# Patient Record
Sex: Female | Born: 1975
Health system: Southern US, Community
[De-identification: ages and names within clinical notes are randomized; demographics above are authoritative.]

## PROBLEM LIST (undated history)

## (undated) DIAGNOSIS — E785 Hyperlipidemia, unspecified: Secondary | ICD-10-CM

## (undated) DIAGNOSIS — I10 Essential (primary) hypertension: Secondary | ICD-10-CM

## (undated) HISTORY — DX: Hyperlipidemia, unspecified: E78.5

## (undated) HISTORY — DX: Essential (primary) hypertension: I10

---

## 2017-09-14 ENCOUNTER — Ambulatory Visit: Payer: Self-pay | Admitting: Adult Health

## 2017-09-20 ENCOUNTER — Ambulatory Visit: Payer: Self-pay | Admitting: Ophthalmology

## 2017-11-30 ENCOUNTER — Ambulatory Visit: Payer: Self-pay | Admitting: Gerontology

## 2017-11-30 ENCOUNTER — Other Ambulatory Visit: Payer: Self-pay

## 2017-11-30 ENCOUNTER — Encounter: Payer: Self-pay | Admitting: Adult Health

## 2017-11-30 VITALS — BP 111/78 | HR 85 | Temp 98.2°F | Ht 62.99 in | Wt 172.1 lb

## 2017-11-30 DIAGNOSIS — I10 Essential (primary) hypertension: Secondary | ICD-10-CM

## 2017-11-30 DIAGNOSIS — Z0000 Encounter for general adult medical examination without abnormal findings: Secondary | ICD-10-CM

## 2017-11-30 DIAGNOSIS — B353 Tinea pedis: Secondary | ICD-10-CM

## 2017-11-30 MED ORDER — AMLODIPINE BESYLATE 5 MG PO TABS: 5.0000 mg | ORAL_TABLET | Freq: Every day | ORAL | 3 refills | Status: DC

## 2017-11-30 MED ORDER — TERBINAFINE HCL 1 % EX CREA: TOPICAL_CREAM | Freq: Two times a day (BID) | CUTANEOUS | Status: DC

## 2017-11-30 MED ORDER — TERBINAFINE HCL 1 % EX CREA: 1.0000 "application " | TOPICAL_CREAM | Freq: Two times a day (BID) | CUTANEOUS | 0 refills | Status: DC

## 2017-11-30 NOTE — Patient Instructions (Addendum)
Excercise DASH Eating Plan DASH stands for "Dietary Approaches to Stop Hypertension." The DASH eating plan is a healthy eating plan that has been shown to reduce high blood pressure (hypertension). It may also reduce your risk for type 2 diabetes, heart disease, and stroke. The DASH eating plan may also help with weight loss. What are tips for following this plan? General guidelines  Avoid eating more than 2,300 mg (milligrams) of salt (sodium) a day. If you have hypertension, you may need to reduce your sodium intake to 1,500 mg a day.  Limit alcohol intake to no more than 1 drink a day for nonpregnant women and 2 drinks a day for men. One drink equals 12 oz of beer, 5 oz of wine, or 1 oz of hard liquor.  Work with your health care provider to maintain a healthy body weight or to lose weight. Ask what an ideal weight is for you.  Get at least 30 minutes of exercise that causes your heart to beat faster (aerobic exercise) most days of the week. Activities may include walking, swimming, or biking.  Work with your health care provider or diet and nutrition specialist (dietitian) to adjust your eating plan to your individual calorie needs. Reading food labels  Check food labels for the amount of sodium per serving. Choose foods with less than 5 percent of the Daily Value of sodium. Generally, foods with less than 300 mg of sodium per serving fit into this eating plan.  To find whole grains, look for the word "whole" as the first word in the ingredient list. Shopping  Buy products labeled as "low-sodium" or "no salt added."  Buy fresh foods. Avoid canned foods and premade or frozen meals. Cooking  Avoid adding salt when cooking. Use salt-free seasonings or herbs instead of table salt or sea salt. Check with your health care provider or pharmacist before using salt substitutes.  Do not fry foods. Cook foods using healthy methods such as baking, boiling, grilling, and broiling  instead.  Cook with heart-healthy oils, such as olive, canola, soybean, or sunflower oil. Meal planning   Eat a balanced diet that includes: ? 5 or more servings of fruits and vegetables each day. At each meal, try to fill half of your plate with fruits and vegetables. ? Up to 6-8 servings of whole grains each day. ? Less than 6 oz of lean meat, poultry, or fish each day. A 3-oz serving of meat is about the same size as a deck of cards. One egg equals 1 oz. ? 2 servings of low-fat dairy each day. ? A serving of nuts, seeds, or beans 5 times each week. ? Heart-healthy fats. Healthy fats called Omega-3 fatty acids are found in foods such as flaxseeds and coldwater fish, like sardines, salmon, and mackerel.  Limit how much you eat of the following: ? Canned or prepackaged foods. ? Food that is high in trans fat, such as fried foods. ? Food that is high in saturated fat, such as fatty meat. ? Sweets, desserts, sugary drinks, and other foods with added sugar. ? Full-fat dairy products.  Do not salt foods before eating.  Try to eat at least 2 vegetarian meals each week.  Eat more home-cooked food and less restaurant, buffet, and fast food.  When eating at a restaurant, ask that your food be prepared with less salt or no salt, if possible. What foods are recommended? The items listed may not be a complete list. Talk with your dietitian about  what dietary choices are best for you. Grains Whole-grain or whole-wheat bread. Whole-grain or whole-wheat pasta. Brown rice. [place]row. Bulgur. Whole-grain and low-sodium cereals. Pita bread. Low-fat, low-sodium crackers. Whole-wheat flour tortillas. Vegetables Fresh or frozen vegetables (raw, steamed, roasted, or grilled). Low-sodium or reduced-sodium tomato and vegetable juice. Low-sodium or reduced-sodium tomato sauce and tomato paste. Low-sodium or reduced-sodium canned vegetables. Fruits All fresh, dried, or frozen fruit. Canned fruit in  natural juice (without added sugar). Meat and other protein foods Skinless chicken or [place]. Ground chicken or [place]. Pork with fat trimmed off. Fish and seafood. Egg whites. Dried beans, peas, or lentils. Unsalted nuts, nut butters, and seeds. Unsalted canned beans. Lean cuts of beef with fat trimmed off. Low-sodium, lean deli meat. Dairy Low-fat (1%) or fat-free (skim) milk. Fat-free, low-fat, or reduced-fat cheeses. Nonfat, low-sodium ricotta or cottage cheese. Low-fat or nonfat yogurt. Low-fat, low-sodium cheese. Fats and oils Soft margarine without trans fats. Vegetable oil. Low-fat, reduced-fat, or light mayonnaise and salad dressings (reduced-sodium). Canola, safflower, olive, soybean, and sunflower oils. Avocado. Seasoning and other foods Herbs. Spices. Seasoning mixes without salt. Unsalted popcorn and pretzels. Fat-free sweets. What foods are not recommended? The items listed may not be a complete list. Talk with your dietitian about what dietary choices are best for you. Grains Baked goods made with fat, such as croissants, muffins, or some breads. Dry pasta or rice meal packs. Vegetables Creamed or fried vegetables. Vegetables in a cheese sauce. Regular canned vegetables (not low-sodium or reduced-sodium). Regular canned tomato sauce and paste (not low-sodium or reduced-sodium). Regular tomato and vegetable juice (not low-sodium or reduced-sodium). Angie Fava. Olives. Fruits Canned fruit in a light or heavy syrup. Fried fruit. Fruit in cream or butter sauce. Meat and other protein foods Fatty cuts of meat. Ribs. Fried meat. Berniece Salines. Sausage. Bologna and other processed lunch meats. Salami. Fatback. Hotdogs. Bratwurst. Salted nuts and seeds. Canned beans with added salt. Canned or smoked fish. Whole eggs or egg yolks. Chicken or [place] with skin. Dairy Whole or 2% milk, cream, and half-and-half. Whole or full-fat cream cheese. Whole-fat or sweetened yogurt. Full-fat cheese. Nondairy  creamers. Whipped toppings. Processed cheese and cheese spreads. Fats and oils Butter. Stick margarine. Lard. Shortening. Ghee. Bacon fat. Tropical oils, such as coconut, palm kernel, or palm oil. Seasoning and other foods Salted popcorn and pretzels. Onion salt, garlic salt, seasoned salt, table salt, and sea salt. Worcestershire sauce. Tartar sauce. Barbecue sauce. Teriyaki sauce. Soy sauce, including reduced-sodium. Steak sauce. Canned and packaged gravies. Fish sauce. Oyster sauce. Cocktail sauce. Horseradish that you find on the shelf. Ketchup. Mustard. Meat flavorings and tenderizers. Bouillon cubes. Hot sauce and Tabasco sauce. Premade or packaged marinades. Premade or packaged taco seasonings. Relishes. Regular salad dressings. Where to find more information:  National Heart, Lung, and [place]: https://wilson-eaton.com/  American Heart Association: www.heart.org Summary  The DASH eating plan is a healthy eating plan that has been shown to reduce high blood pressure (hypertension). It may also reduce your risk for type 2 diabetes, heart disease, and stroke.  With the DASH eating plan, you should limit salt (sodium) intake to 2,300 mg a day. If you have hypertension, you may need to reduce your sodium intake to 1,500 mg a day.  When on the DASH eating plan, aim to eat more fresh fruits and vegetables, whole grains, lean proteins, low-fat dairy, and heart-healthy fats.  Work with your health care provider or diet and nutrition specialist (dietitian) to adjust your eating plan to your  individual calorie needs. This information is not intended to replace advice given to you by your health care provider. Make sure you discuss any questions you have with your health care provider. Document Released: 01/20/2011 Document Revised: 01/25/2016 Document Reviewed: 01/25/2016 Elsevier Interactive Patient Education  [employer].

## 2017-11-30 NOTE — Progress Notes (Signed)
  Patient: Nicole Lambert Female    DOB: Nov 02, 1975   42 y.o.   MRN: [id number] Visit Date: 11/30/2017  Today's Provider: Langston Reusing, NP   Chief Complaint  Patient presents with  . Follow-up    high blood pressure   Subjective:    HPI   Nicole Lambert is a 42y/o female that presents to establish care and with c/o occasional headache associated with hypertension.States that she takes 5mg  Amlodipine when she has headache with some relief. She states that her headache is not controlled with tylenol. She states Amlodipine was prescribed in [place] by her provider last year. She states her last HA was last week which was relieved after taking 5 mg of Amlodipine, and her previous HA was 6 months ago. Denies chest pain, palpitation, dizziness , light headedness, Shortness of breath. Patient has an interpreter. Patient c/o itching between 4th and 5th toe of both feet that has being going on for a long time. Not on File Previous Medications   AMLODIPINE (NORVASC) 5 MG TABLET    Take 5 mg by mouth daily.    Review of Systems  Constitutional: Negative.   Eyes: Negative.   Respiratory: Negative.   Cardiovascular: Negative.   Gastrointestinal: Negative.   Endocrine: Negative.   Genitourinary: Negative.   Musculoskeletal: Negative.   Skin: Negative.   Neurological: Negative.   Psychiatric/Behavioral: Negative.     Social History   Tobacco Use  . Smoking status: Never Smoker  . Smokeless tobacco: Never Used  Substance Use Topics  . Alcohol use: Never    Frequency: Never   Objective:   BP 111/78 (BP Location: Right Arm, Patient Position: Sitting)   Pulse 85   Temp 98.2 F (36.8 C)   Ht 5' 2.99" (1.6 m)   Wt 172 lb 1.6 oz (78.1 kg)   SpO2 99%   BMI 30.49 kg/m   Physical Exam  Constitutional: She is oriented to person, place, and time. She appears well-developed and well-nourished.  HENT:  Head: Normocephalic and atraumatic.  Eyes: Pupils are equal, round, and reactive to  light. Conjunctivae and EOM are normal.  Neck: Normal range of motion. Neck supple.  Cardiovascular: Normal rate, regular rhythm, normal heart sounds and intact distal pulses.  Pulmonary/Chest: Effort normal and breath sounds normal.  Abdominal: Soft. Bowel sounds are normal.  Musculoskeletal: Normal range of motion. She exhibits no edema.  Neurological: She is alert and oriented to person, place, and time.  Skin: Skin is warm and dry.  Psychiatric: She has a normal mood and affect. Her behavior is normal. Judgment and thought content normal.  Whitish discoloration between 4th and 5th toes of feet.      Assessment & Plan:     1. Hypertension, unspecified type Take 2.5mg  Amlodipine daily, monitor Bp , exercise 30 mins a day and dash diet. BP was 111/78, vital sign was verified.  2. Health care maintenance Exercise 30 mins a day and Dash diet.  Tinea Pedis both 4-5th toe both feet Terbinafine cream apply twice a day for 4 weeks        Emmalea Treanor Jerold Coombe, NP   [facility]

## 2017-12-05 ENCOUNTER — Ambulatory Visit: Payer: Self-pay | Admitting: Pharmacy Technician

## 2017-12-05 DIAGNOSIS — Z79899 Other long term (current) drug therapy: Secondary | ICD-10-CM

## 2017-12-06 ENCOUNTER — Ambulatory Visit: Payer: Self-pay

## 2017-12-06 DIAGNOSIS — Z0000 Encounter for general adult medical examination without abnormal findings: Secondary | ICD-10-CM

## 2017-12-06 DIAGNOSIS — I10 Essential (primary) hypertension: Secondary | ICD-10-CM

## 2017-12-07 LAB — CBC WITH DIFFERENTIAL/PLATELET
BASOS: 0 %
Basophils Absolute: 0 10*3/uL (ref 0.0–0.2)
EOS (ABSOLUTE): 0.1 10*3/uL (ref 0.0–0.4)
EOS: 1 %
HEMOGLOBIN: 13.7 g/dL (ref 11.1–15.9)
Hematocrit: 39.8 % (ref 34.0–46.6)
IMMATURE GRANS (ABS): 0 10*3/uL (ref 0.0–0.1)
Immature Granulocytes: 0 %
LYMPHS: 34 %
Lymphocytes Absolute: 2.1 10*3/uL (ref 0.7–3.1)
MCH: 29.5 pg (ref 26.6–33.0)
MCHC: 34.4 g/dL (ref 31.5–35.7)
MCV: 86 fL (ref 79–97)
Monocytes Absolute: 0.5 10*3/uL (ref 0.1–0.9)
Monocytes: 8 %
NEUTROS ABS: 3.5 10*3/uL (ref 1.4–7.0)
NEUTROS PCT: 57 %
PLATELETS: 221 10*3/uL (ref 150–450)
RBC: 4.65 x10E6/uL (ref 3.77–5.28)
RDW: 12.6 % (ref 12.3–15.4)
WBC: 6.2 10*3/uL (ref 3.4–10.8)

## 2017-12-07 LAB — TSH: TSH: 1.62 u[IU]/mL (ref 0.450–4.500)

## 2017-12-07 LAB — HEMOGLOBIN A1C
ESTIMATED AVERAGE GLUCOSE: 103 mg/dL
Hgb A1c MFr Bld: 5.2 % (ref 4.8–5.6)

## 2017-12-07 LAB — URINALYSIS
Bilirubin, UA: NEGATIVE
Glucose, UA: NEGATIVE
Ketones, UA: NEGATIVE
NITRITE UA: NEGATIVE
PH UA: 6 (ref 5.0–7.5)
Protein, UA: NEGATIVE
SPEC GRAV UA: 1.008 (ref 1.005–1.030)
UUROB: 0.2 mg/dL (ref 0.2–1.0)

## 2017-12-07 LAB — COMPREHENSIVE METABOLIC PANEL
ALBUMIN: 4.2 g/dL (ref 3.5–5.5)
ALK PHOS: 62 IU/L (ref 39–117)
ALT: 9 IU/L (ref 0–32)
AST: 10 IU/L (ref 0–40)
Albumin/Globulin Ratio: 1.6 (ref 1.2–2.2)
BILIRUBIN TOTAL: 0.5 mg/dL (ref 0.0–1.2)
BUN / CREAT RATIO: 11 (ref 9–23)
BUN: 7 mg/dL (ref 6–24)
CHLORIDE: 102 mmol/L (ref 96–106)
CO2: 24 mmol/L (ref 20–29)
CREATININE: 0.66 mg/dL (ref 0.57–1.00)
Calcium: 9.6 mg/dL (ref 8.7–10.2)
GFR calc Af Amer: 124 mL/min/{1.73_m2} (ref >59)
GFR calc non Af Amer: 108 mL/min/{1.73_m2} (ref >59)
GLUCOSE: 92 mg/dL (ref 65–99)
Globulin, Total: 2.7 g/dL (ref 1.5–4.5)
Potassium: 4.6 mmol/L (ref 3.5–5.2)
Sodium: 139 mmol/L (ref 134–144)
Total Protein: 6.9 g/dL (ref 6.0–8.5)

## 2017-12-07 LAB — LIPID PANEL
CHOLESTEROL TOTAL: 223 mg/dL — AB (ref 100–199)
Chol/HDL Ratio: 5.3 ratio — ABNORMAL HIGH (ref 0.0–4.4)
HDL: 42 mg/dL (ref >39)
LDL CALC: 147 mg/dL — AB (ref 0–99)
TRIGLYCERIDES: 170 mg/dL — AB (ref 0–149)
VLDL Cholesterol Cal: 34 mg/dL (ref 5–40)

## 2017-12-07 NOTE — Progress Notes (Signed)
Completed [facility] application and contract.  Patient agreed to all terms of the [facility] contract.    Patient approved to receive medication assistance at [facility] as long as eligibility criteria continues to be met.   Provided patient with community resource material based on her particular needs.   Patient speaks Arabic.  Interpretation provided by [employer].   [facility]

## 2017-12-14 ENCOUNTER — Encounter: Payer: Self-pay | Admitting: Gerontology

## 2017-12-14 ENCOUNTER — Ambulatory Visit: Payer: Self-pay | Admitting: Gerontology

## 2017-12-14 ENCOUNTER — Other Ambulatory Visit: Payer: Self-pay

## 2017-12-14 ENCOUNTER — Ambulatory Visit: Payer: Self-pay | Admitting: Ophthalmology

## 2017-12-14 VITALS — BP 116/81 | HR 86 | Temp 98.2°F | Ht 64.96 in | Wt 168.2 lb

## 2017-12-14 DIAGNOSIS — I10 Essential (primary) hypertension: Secondary | ICD-10-CM

## 2017-12-14 DIAGNOSIS — B353 Tinea pedis: Secondary | ICD-10-CM

## 2017-12-14 DIAGNOSIS — Z0000 Encounter for general adult medical examination without abnormal findings: Secondary | ICD-10-CM

## 2017-12-14 DIAGNOSIS — E7849 Other hyperlipidemia: Secondary | ICD-10-CM

## 2017-12-14 MED ORDER — AMLODIPINE BESYLATE 5 MG PO TABS: 2.5000 mg | ORAL_TABLET | Freq: Every day | ORAL | 0 refills | Status: DC

## 2017-12-14 NOTE — Progress Notes (Signed)
  Patient: Nicole Lambert Female    DOB: 12-Oct-1975   42 y.o.   MRN: [id number] Visit Date: 12/14/2017  Today's Provider: Langston Reusing, NP   Chief Complaint  Patient presents with  . Follow-up    hypertension   Subjective:    HPI [name] 42 y/o presents for follow up on Hypertension and lab review. States that she checks BP more often. Denies any headache, chest pain, palpitation , shortness of breath and no peripheral edema .  Lab review: RBC + was present in urine sample , denies any dysuria, frequency, flank or abdominal pain.Lipid panel was abnormal. Otherwise in good health.  States fungal infection to toes is improving with the application of terbinafine twice daily in between toes, denies itching in between the toes.    Not on File Previous Medications   AMLODIPINE (NORVASC) 5 MG TABLET    Take 1 tablet (5 mg total) by mouth daily.   TERBINAFINE (LAMISIL AT) 1 % CREAM    Apply 1 application topically 2 (two) times daily. Apply between 4th and 5th great toes bid  Until healed    Review of Systems  HENT: Negative.   Eyes: Negative.   Respiratory: Negative.   Cardiovascular: Negative.   Gastrointestinal: Negative.   Genitourinary: Negative.  Negative for menstrual problem.  Skin: Negative.   Neurological: Negative.   Psychiatric/Behavioral: Negative.     Social History   Tobacco Use  . Smoking status: Never Smoker  . Smokeless tobacco: Never Used  Substance Use Topics  . Alcohol use: Never    Frequency: Never   Objective:   BP 116/81 (BP Location: Left Wrist, Patient Position: Sitting)   Pulse 86   Temp 98.2 F (36.8 C)   Ht 5' 4.96" (1.65 m)   Wt 168 lb 3.2 oz (76.3 kg)   SpO2 99%   BMI 28.02 kg/m   Physical Exam  Constitutional: She is oriented to person, place, and time. She appears well-developed and well-nourished.  HENT:  Head: Normocephalic and atraumatic.  Eyes: Pupils are equal, round, and reactive to light. EOM are normal.   Cardiovascular: Normal rate and regular rhythm.  Pulmonary/Chest: Effort normal and breath sounds normal.  Abdominal: Soft. Bowel sounds are normal.  Musculoskeletal: Normal range of motion.  Neurological: She is alert and oriented to person, place, and time.  Skin: Skin is warm and dry.  Psychiatric: She has a normal mood and affect.        Assessment & Plan:     1. Hypertension, unspecified type  - amLODipine (NORVASC) 5 MG tablet; Take 0.5 tablets (2.5 mg total) by mouth daily.  Dispense: 60 tablet; Refill: 0 - Urinalysis; Future Monitor BP and keep log. Amlodipine dose decreased to 2.5mg  daily. Follow up in 1 month 2. Health care maintenance  - Flu Vaccine MDCK QUAD PF administered, Mammogram and pap smear referral Ophthalmology done today Rbc + in urine, will recheck urinalysis next week.  3. Tinea pedis of both feet Continue terbinafine twice daily  4. Hyperlipidemia due to dietary fat intake Advised to continue low fat low cholesterol diet, exercise 30 minutes daily. - Lipid Profile; Future       Briza Bark Jerold Coombe, NP   [facility]

## 2017-12-14 NOTE — Patient Instructions (Signed)
Calorie Counting for Weight Loss Calories are units of energy. Your body needs a certain amount of calories from food to keep you going throughout the day. When you eat more calories than your body needs, your body stores the extra calories as fat. When you eat fewer calories than your body needs, your body burns fat to get the energy it needs. Calorie counting means keeping track of how many calories you eat and drink each day. Calorie counting can be helpful if you need to lose weight. If you make sure to eat fewer calories than your body needs, you should lose weight. Ask your health care provider what a healthy weight is for you. For calorie counting to work, you will need to eat the right number of calories in a day in order to lose a healthy amount of weight per week. A dietitian can help you determine how many calories you need in a day and will give you suggestions on how to reach your calorie goal.  A healthy amount of weight to lose per week is usually 1-2 lb (0.5-0.9 kg). This usually means that your daily calorie intake should be reduced by 500-750 calories.  Eating 1,200 - 1,500 calories per day can help most women lose weight.  Eating 1,500 - 1,800 calories per day can help most men lose weight.  What is my plan? My goal is to have __________ calories per day. If I have this many calories per day, I should lose around __________ pounds per week. What do I need to know about calorie counting? In order to meet your daily calorie goal, you will need to:  Find out how many calories are in each food you would like to eat. Try to do this before you eat.  Decide how much of the food you plan to eat.  Write down what you ate and how many calories it had. Doing this is called keeping a food log.  To successfully lose weight, it is important to balance calorie counting with a healthy lifestyle that includes regular activity. Aim for 150 minutes of moderate exercise (such as walking) or 75  minutes of vigorous exercise (such as running) each week. Where do I find calorie information?  The number of calories in a food can be found on a Nutrition Facts label. If a food does not have a Nutrition Facts label, try to look up the calories online or ask your dietitian for help. Remember that calories are listed per serving. If you choose to have more than one serving of a food, you will have to multiply the calories per serving by the amount of servings you plan to eat. For example, the label on a package of bread might say that a serving size is 1 slice and that there are 90 calories in a serving. If you eat 1 slice, you will have eaten 90 calories. If you eat 2 slices, you will have eaten 180 calories. How do I keep a food log? Immediately after each meal, record the following information in your food log:  What you ate. Don't forget to include toppings, sauces, and other extras on the food.  How much you ate. This can be measured in cups, ounces, or number of items.  How many calories each food and drink had.  The total number of calories in the meal.  Keep your food log near you, such as in a small notebook in your pocket, or use a mobile app or website. Some   programs will calculate calories for you and show you how many calories you have left for the day to meet your goal. What are some calorie counting tips?  Use your calories on foods and drinks that will fill you up and not leave you hungry: ? Some examples of foods that fill you up are nuts and nut butters, vegetables, lean proteins, and high-fiber foods like whole grains. High-fiber foods are foods with more than 5 g fiber per serving. ? Drinks such as sodas, specialty coffee drinks, alcohol, and juices have a lot of calories, yet do not fill you up.  Eat nutritious foods and avoid empty calories. Empty calories are calories you get from foods or beverages that do not have many vitamins or protein, such as candy, sweets, and  soda. It is better to have a nutritious high-calorie food (such as an avocado) than a food with few nutrients (such as a bag of chips).  Know how many calories are in the foods you eat most often. This will help you calculate calorie counts faster.  Pay attention to calories in drinks. Low-calorie drinks include water and unsweetened drinks.  Pay attention to nutrition labels for "low fat" or "fat free" foods. These foods sometimes have the same amount of calories or more calories than the full fat versions. They also often have added sugar, starch, or salt, to make up for flavor that was removed with the fat.  Find a way of tracking calories that works for you. Get creative. Try different apps or programs if writing down calories does not work for you. What are some portion control tips?  Know how many calories are in a serving. This will help you know how many servings of a certain food you can have.  Use a measuring cup to measure serving sizes. You could also try weighing out portions on a kitchen scale. With time, you will be able to estimate serving sizes for some foods.  Take some time to put servings of different foods on your favorite plates, bowls, and cups so you know what a serving looks like.  Try not to eat straight from a bag or box. Doing this can lead to overeating. Put the amount you would like to eat in a cup or on a plate to make sure you are eating the right portion.  Use smaller plates, glasses, and bowls to prevent overeating.  Try not to multitask (for example, watch TV or use your computer) while eating. If it is time to eat, sit down at a table and enjoy your food. This will help you to know when you are full. It will also help you to be aware of what you are eating and how much you are eating. What are tips for following this plan? Reading food labels  Check the calorie count compared to the serving size. The serving size may be smaller than what you are used to  eating.  Check the source of the calories. Make sure the food you are eating is high in vitamins and protein and low in saturated and trans fats. Shopping  Read nutrition labels while you shop. This will help you make healthy decisions before you decide to purchase your food.  Make a grocery list and stick to it. Cooking  Try to cook your favorite foods in a healthier way. For example, try baking instead of frying.  Use low-fat dairy products. Meal planning  Use more fruits and vegetables. Half of your plate should   be fruits and vegetables.  Include lean proteins like poultry and fish. How do I count calories when eating out?  Ask for smaller portion sizes.  Consider sharing an entree and sides instead of getting your own entree.  If you get your own entree, eat only half. Ask for a box at the beginning of your meal and put the rest of your entree in it so you are not tempted to eat it.  If calories are listed on the menu, choose the lower calorie options.  Choose dishes that include vegetables, fruits, whole grains, low-fat dairy products, and lean protein.  Choose items that are boiled, broiled, grilled, or steamed. Stay away from items that are buttered, battered, fried, or served with cream sauce. Items labeled "crispy" are usually fried, unless stated otherwise.  Choose water, low-fat milk, unsweetened iced tea, or other drinks without added sugar. If you want an alcoholic beverage, choose a lower calorie option such as a glass of wine or light beer.  Ask for dressings, sauces, and syrups on the side. These are usually high in calories, so you should limit the amount you eat.  If you want a salad, choose a garden salad and ask for grilled meats. Avoid extra toppings like bacon, cheese, or fried items. Ask for the dressing on the side, or ask for olive oil and vinegar or lemon to use as dressing.  Estimate how many servings of a food you are given. For example, a serving of  cooked rice is  cup or about the size of half a baseball. Knowing serving sizes will help you be aware of how much food you are eating at restaurants. The list below tells you how big or small some common portion sizes are based on everyday objects: ? 1 oz-4 stacked dice. ? 3 oz-1 deck of cards. ? 1 tsp-1 die. ? 1 Tbsp- a ping-pong ball. ? 2 Tbsp-1 ping-pong ball. ?  cup- baseball. ? 1 cup-1 baseball. Summary  Calorie counting means keeping track of how many calories you eat and drink each day. If you eat fewer calories than your body needs, you should lose weight.  A healthy amount of weight to lose per week is usually 1-2 lb (0.5-0.9 kg). This usually means reducing your daily calorie intake by 500-750 calories.  The number of calories in a food can be found on a Nutrition Facts label. If a food does not have a Nutrition Facts label, try to look up the calories online or ask your dietitian for help.  Use your calories on foods and drinks that will fill you up, and not on foods and drinks that will leave you hungry.  Use smaller plates, glasses, and bowls to prevent overeating. This information is not intended to replace advice given to you by your health care provider. Make sure you discuss any questions you have with your health care provider. Document Released: 01/31/2005 Document Revised: 01/01/2016 Document Reviewed: 01/01/2016 Elsevier Interactive Patient Education  2018 [employer]. Fat and Cholesterol Restricted Diet Getting too much fat and cholesterol in your diet may cause health problems. Following this diet helps keep your fat and cholesterol at normal levels. This can keep you from getting sick. What types of fat should I choose?  Choose monosaturated and polyunsaturated fats. These are found in foods such as olive oil, canola oil, flaxseeds, walnuts, almonds, and seeds.  Eat more omega-3 fats. Good choices include salmon, mackerel, sardines, tuna, flaxseed oil, and  ground flaxseeds.  Limit  saturated fats. These are in animal products such as meats, butter, and cream. They can also be in plant products such as palm oil, palm kernel oil, and coconut oil.  Avoid foods with partially hydrogenated oils in them. These contain trans fats. Examples of foods that have trans fats are stick margarine, some tub margarines, cookies, crackers, and other baked goods. What general guidelines do I need to follow?  Check food labels. Look for the words "trans fat" and "saturated fat."  When preparing a meal: ? Fill half of your plate with vegetables and green salads. ? Fill one fourth of your plate with whole grains. Look for the word "whole" as the first word in the ingredient list. ? Fill one fourth of your plate with lean protein foods.  Eat more foods that have fiber, like apples, carrots, beans, peas, and barley.  Eat more home-cooked foods. Eat less at restaurants and buffets.  Limit or avoid alcohol.  Limit foods high in starch and sugar.  Limit fried foods.  Cook foods without frying them. Baking, boiling, grilling, and broiling are all great options.  Lose weight if you are overweight. Losing even a small amount of weight can help your overall health. It can also help prevent diseases such as diabetes and heart disease. What foods can I eat? Grains Whole grains, such as whole wheat or whole grain breads, crackers, cereals, and pasta. Unsweetened oatmeal, bulgur, barley, quinoa, or brown rice. Corn or whole wheat flour tortillas. Vegetables Fresh or frozen vegetables (raw, steamed, roasted, or grilled). Green salads. Fruits All fresh, canned (in natural juice), or frozen fruits. Meat and Other Protein Products Ground beef (85% or leaner), grass-fed beef, or beef trimmed of fat. Skinless chicken or [place]. Ground chicken or [place]. Pork trimmed of fat. All fish and seafood. Eggs. Dried beans, peas, or lentils. Unsalted nuts or seeds. Unsalted canned or  dry beans. Dairy Low-fat dairy products, such as skim or 1% milk, 2% or reduced-fat cheeses, low-fat ricotta or cottage cheese, or plain low-fat yogurt. Fats and Oils Tub margarines without trans fats. Light or reduced-fat mayonnaise and salad dressings. Avocado. Olive, canola, sesame, or safflower oils. Natural peanut or almond butter (choose ones without added sugar and oil). The items listed above may not be a complete list of recommended foods or beverages. Contact your dietitian for more options. What foods are not recommended? Grains White bread. White pasta. White rice. Cornbread. Bagels, pastries, and croissants. Crackers that contain trans fat. Vegetables White potatoes. Corn. Creamed or fried vegetables. Vegetables in a cheese sauce. Fruits Dried fruits. Canned fruit in light or heavy syrup. Fruit juice. Meat and Other Protein Products Fatty cuts of meat. Ribs, chicken wings, bacon, sausage, bologna, salami, chitterlings, fatback, hot dogs, bratwurst, and packaged luncheon meats. Liver and organ meats. Dairy Whole or 2% milk, cream, half-and-half, and cream cheese. Whole milk cheeses. Whole-fat or sweetened yogurt. Full-fat cheeses. Nondairy creamers and whipped toppings. Processed cheese, cheese spreads, or cheese curds. Sweets and Desserts Corn syrup, sugars, honey, and molasses. Candy. Jam and jelly. Syrup. Sweetened cereals. Cookies, pies, cakes, donuts, muffins, and ice cream. Fats and Oils Butter, stick margarine, lard, shortening, ghee, or bacon fat. Coconut, palm kernel, or palm oils. Beverages Alcohol. Sweetened drinks (such as sodas, lemonade, and fruit drinks or punches). The items listed above may not be a complete list of foods and beverages to avoid. Contact your dietitian for more information. This information is not intended to replace advice given to you by  your health care provider. Make sure you discuss any questions you have with your health care  provider. Document Released: 08/02/2011 Document Revised: 10/08/2015 Document Reviewed: 05/02/2013 Elsevier Interactive Patient Education  [employer].

## 2018-01-03 ENCOUNTER — Other Ambulatory Visit: Payer: Self-pay

## 2018-01-03 DIAGNOSIS — E7849 Other hyperlipidemia: Secondary | ICD-10-CM

## 2018-01-03 DIAGNOSIS — I10 Essential (primary) hypertension: Secondary | ICD-10-CM

## 2018-01-04 LAB — URINALYSIS
Bilirubin, UA: NEGATIVE
Glucose, UA: NEGATIVE
Ketones, UA: NEGATIVE
LEUKOCYTES UA: NEGATIVE
NITRITE UA: NEGATIVE
PH UA: 6.5 (ref 5.0–7.5)
Protein, UA: NEGATIVE
SPEC GRAV UA: 1.007 (ref 1.005–1.030)
UUROB: 0.2 mg/dL (ref 0.2–1.0)

## 2018-01-04 LAB — LIPID PANEL
Chol/HDL Ratio: 5.5 ratio — ABNORMAL HIGH (ref 0.0–4.4)
Cholesterol, Total: 216 mg/dL — ABNORMAL HIGH (ref 100–199)
HDL: 39 mg/dL — AB (ref >39)
LDL Calculated: 138 mg/dL — ABNORMAL HIGH (ref 0–99)
Triglycerides: 193 mg/dL — ABNORMAL HIGH (ref 0–149)
VLDL Cholesterol Cal: 39 mg/dL (ref 5–40)

## 2018-01-18 ENCOUNTER — Encounter: Payer: Self-pay | Admitting: Gerontology

## 2018-01-18 ENCOUNTER — Other Ambulatory Visit: Payer: Self-pay

## 2018-01-18 ENCOUNTER — Ambulatory Visit: Payer: Self-pay | Admitting: Gerontology

## 2018-01-18 VITALS — BP 109/73 | HR 77 | Wt 171.9 lb

## 2018-01-18 DIAGNOSIS — E7849 Other hyperlipidemia: Secondary | ICD-10-CM

## 2018-01-18 DIAGNOSIS — B353 Tinea pedis: Secondary | ICD-10-CM

## 2018-01-18 DIAGNOSIS — R319 Hematuria, unspecified: Secondary | ICD-10-CM

## 2018-01-18 DIAGNOSIS — I10 Essential (primary) hypertension: Secondary | ICD-10-CM

## 2018-01-18 NOTE — Patient Instructions (Signed)
Fat and Cholesterol Restricted Diet Getting too much fat and cholesterol in your diet may cause health problems. Following this diet helps keep your fat and cholesterol at normal levels. This can keep you from getting sick. What types of fat should I choose?  Choose monosaturated and polyunsaturated fats. These are found in foods such as olive oil, canola oil, flaxseeds, walnuts, almonds, and seeds.  Eat more omega-3 fats. Good choices include salmon, mackerel, sardines, tuna, flaxseed oil, and ground flaxseeds.  Limit saturated fats. These are in animal products such as meats, butter, and cream. They can also be in plant products such as palm oil, palm kernel oil, and coconut oil.  Avoid foods with partially hydrogenated oils in them. These contain trans fats. Examples of foods that have trans fats are stick margarine, some tub margarines, cookies, crackers, and other baked goods. What general guidelines do I need to follow?  Check food labels. Look for the words "trans fat" and "saturated fat."  When preparing a meal: ? Fill half of your plate with vegetables and green salads. ? Fill one fourth of your plate with whole grains. Look for the word "whole" as the first word in the ingredient list. ? Fill one fourth of your plate with lean protein foods.  Eat more foods that have fiber, like apples, carrots, beans, peas, and barley.  Eat more home-cooked foods. Eat less at restaurants and buffets.  Limit or avoid alcohol.  Limit foods high in starch and sugar.  Limit fried foods.  Cook foods without frying them. Baking, boiling, grilling, and broiling are all great options.  Lose weight if you are overweight. Losing even a small amount of weight can help your overall health. It can also help prevent diseases such as diabetes and heart disease. What foods can I eat? Grains Whole grains, such as whole wheat or whole grain breads, crackers, cereals, and pasta. Unsweetened oatmeal,  bulgur, barley, quinoa, or brown rice. Corn or whole wheat flour tortillas. Vegetables Fresh or frozen vegetables (raw, steamed, roasted, or grilled). Green salads. Fruits All fresh, canned (in natural juice), or frozen fruits. Meat and Other Protein Products Ground beef (85% or leaner), grass-fed beef, or beef trimmed of fat. Skinless chicken or turkey. Ground chicken or turkey. Pork trimmed of fat. All fish and seafood. Eggs. Dried beans, peas, or lentils. Unsalted nuts or seeds. Unsalted canned or dry beans. Dairy Low-fat dairy products, such as skim or 1% milk, 2% or reduced-fat cheeses, low-fat ricotta or cottage cheese, or plain low-fat yogurt. Fats and Oils Tub margarines without trans fats. Light or reduced-fat mayonnaise and salad dressings. Avocado. Olive, canola, sesame, or safflower oils. Natural peanut or almond butter (choose ones without added sugar and oil). The items listed above may not be a complete list of recommended foods or beverages. Contact your dietitian for more options. What foods are not recommended? Grains White bread. White pasta. White rice. Cornbread. Bagels, pastries, and croissants. Crackers that contain trans fat. Vegetables White potatoes. Corn. Creamed or fried vegetables. Vegetables in a cheese sauce. Fruits Dried fruits. Canned fruit in light or heavy syrup. Fruit juice. Meat and Other Protein Products Fatty cuts of meat. Ribs, chicken wings, bacon, sausage, bologna, salami, chitterlings, fatback, hot dogs, bratwurst, and packaged luncheon meats. Liver and organ meats. Dairy Whole or 2% milk, cream, half-and-half, and cream cheese. Whole milk cheeses. Whole-fat or sweetened yogurt. Full-fat cheeses. Nondairy creamers and whipped toppings. Processed cheese, cheese spreads, or cheese curds. Sweets and Desserts Corn   syrup, sugars, honey, and molasses. Candy. Jam and jelly. Syrup. Sweetened cereals. Cookies, pies, cakes, donuts, muffins, and ice  cream. Fats and Oils Butter, stick margarine, lard, shortening, ghee, or bacon fat. Coconut, palm kernel, or palm oils. Beverages Alcohol. Sweetened drinks (such as sodas, lemonade, and fruit drinks or punches). The items listed above may not be a complete list of foods and beverages to avoid. Contact your dietitian for more information. This information is not intended to replace advice given to you by your health care provider. Make sure you discuss any questions you have with your health care provider. Document Released: 08/02/2011 Document Revised: 10/08/2015 Document Reviewed: 05/02/2013 Elsevier Interactive Patient Education  2018 [employer] Inc[employer]  

## 2018-01-18 NOTE — Progress Notes (Signed)
  Patient: Nicole Lambert Female    DOB: May 28, 1975   42 y.o.   MRN: [id number] Visit Date: 01/18/2018  Today's Provider: Langston Reusing, NP   No chief complaint on file.  Subjective:    HPI Nicole Lambert 42 y/o female presents for follow up on hypertension, tinea pedis and lab review. She reports taking 2.5 mg Amlodipine daily, denies headache, chest pain, palpitation, shortness of breath, dizziness and peripheral edema.  She continues to apply terbinafine 1 % cream for tinea pedis of bilateral 3rd, 4th and 5 th toes with 50% improvement. She denies itching to toes but still has some whitish coloration at the affected site..  Lab was reviewed, RBC 2+, she stated that urine sample was collected on the first day of her mensuration cycle . She denies flank pain, dysuria, fever or chills. Otherwise she reports being in good health.      Not on File Previous Medications   AMLODIPINE (NORVASC) 5 MG TABLET    Take 0.5 tablets (2.5 mg total) by mouth daily.   TERBINAFINE (LAMISIL AT) 1 % CREAM    Apply 1 application topically 2 (two) times daily. Apply between 4th and 5th great toes bid  Until healed    Review of Systems  Constitutional: Negative.   HENT: Negative.   Respiratory: Negative.   Cardiovascular: Negative.   Gastrointestinal: Negative.   Genitourinary: Negative.   Musculoskeletal: Negative.   Skin: Color change: whitish color inbetween toes.  Neurological: Negative.   Psychiatric/Behavioral: Negative.     Social History   Tobacco Use  . Smoking status: Never Smoker  . Smokeless tobacco: Never Used  Substance Use Topics  . Alcohol use: Never    Frequency: Never   Objective:   There were no vitals taken for this visit.  Physical Exam  Constitutional: She is oriented to person, place, and time. She appears well-developed.  HENT:  Head: Normocephalic and atraumatic.  Eyes: EOM are normal.  Neck: Normal range of motion.  Cardiovascular: Normal rate and regular  rhythm.  Pulmonary/Chest: Effort normal and breath sounds normal.  Abdominal: Soft. Bowel sounds are normal.  Musculoskeletal: Normal range of motion.  Neurological: She is alert and oriented to person, place, and time.  Skin:     Psychiatric: She has a normal mood and affect. Her behavior is normal. Judgment and thought content normal.        Assessment & Plan:     1. Hypertension, unspecified type -Controlled BP, she will continue on 2.5 mg Amlodipine and low salt diet, exercise at least 30 minutes daily.  2. Tinea pedis of both feet - Will continue applying terbinafine 1 % cream 2 times daily.  3. Hyperlipidemia due to dietary fat intake - She was advised to continue on low fat and low cholesterol diet, exercise 30 minutes daily and will recheck - Lipid Profile; Future  4. Hematuria, unspecified type - Will collect urine for UA today for recheck - Urinalysis       Marquita Lias Jerold Coombe, NP   [facility]

## 2018-01-19 LAB — URINALYSIS
BILIRUBIN UA: NEGATIVE
Glucose, UA: NEGATIVE
Ketones, UA: NEGATIVE
Leukocytes, UA: NEGATIVE
Nitrite, UA: NEGATIVE
PH UA: 5 (ref 5.0–7.5)
Protein, UA: NEGATIVE
Specific Gravity, UA: 1.017 (ref 1.005–1.030)
UUROB: 0.2 mg/dL (ref 0.2–1.0)

## 2018-01-24 ENCOUNTER — Other Ambulatory Visit: Payer: Self-pay | Admitting: Internal Medicine

## 2018-01-24 DIAGNOSIS — I10 Essential (primary) hypertension: Secondary | ICD-10-CM

## 2018-02-21 ENCOUNTER — Other Ambulatory Visit: Payer: Self-pay | Admitting: Gerontology

## 2018-02-21 ENCOUNTER — Ambulatory Visit: Payer: Self-pay | Admitting: Urology

## 2018-02-21 ENCOUNTER — Other Ambulatory Visit: Payer: Self-pay

## 2018-02-21 DIAGNOSIS — I10 Essential (primary) hypertension: Secondary | ICD-10-CM

## 2018-02-21 MED ORDER — AMLODIPINE BESYLATE 5 MG PO TABS: ORAL_TABLET | ORAL | 0 refills | Status: DC

## 2018-03-14 ENCOUNTER — Other Ambulatory Visit: Payer: Self-pay

## 2018-03-14 DIAGNOSIS — E7849 Other hyperlipidemia: Secondary | ICD-10-CM

## 2018-03-15 LAB — LIPID PANEL
CHOL/HDL RATIO: 5.7 ratio — AB (ref 0.0–4.4)
Cholesterol, Total: 177 mg/dL (ref 100–199)
HDL: 31 mg/dL — AB (ref >39)
LDL Calculated: 79 mg/dL (ref 0–99)
TRIGLYCERIDES: 336 mg/dL — AB (ref 0–149)
VLDL CHOLESTEROL CAL: 67 mg/dL — AB (ref 5–40)

## 2018-03-21 ENCOUNTER — Encounter: Payer: Self-pay | Admitting: *Deleted

## 2018-03-21 ENCOUNTER — Ambulatory Visit
Admission: RE | Admit: 2018-03-21 | Discharge: 2018-03-21 | Disposition: A | Discharge status: Home / Self Care | Payer: Self-pay | Source: Ambulatory Visit | Attending: Oncology | Admitting: Oncology

## 2018-03-21 ENCOUNTER — Ambulatory Visit: Payer: Self-pay | Attending: Oncology | Admitting: *Deleted

## 2018-03-21 ENCOUNTER — Other Ambulatory Visit: Payer: Self-pay

## 2018-03-21 VITALS — BP 109/76 | HR 77 | Temp 98.3°F | Ht 63.25 in | Wt 173.2 lb

## 2018-03-21 DIAGNOSIS — Z0000 Encounter for general adult medical examination without abnormal findings: Secondary | ICD-10-CM | POA: Insufficient documentation

## 2018-03-21 NOTE — Progress Notes (Signed)
  Subjective:     Patient ID: Nicole Lambert, female   DOB: 26-Aug-1975, 43 y.o.   MRN: [id number]  HPI   Review of Systems     Objective:   Physical Exam Chest:    Abdominal:     Palpations: There is no hepatomegaly, splenomegaly or mass.     Tenderness: There is no abdominal tenderness.  Genitourinary:    Exam position: Lithotomy position.     Labia:        Right: No rash, tenderness, lesion or injury.        Left: No rash, tenderness, lesion or injury.      Cervix: No cervical motion tenderness, discharge, friability, lesion, erythema, cervical bleeding or eversion.     Uterus: Not deviated, not enlarged, not fixed, not tender and no uterine prolapse.      Adnexa:        Right: No mass, tenderness or fullness.         Left: No mass, tenderness or fullness.       Rectum: No mass.           Assessment:     43 year old female from [place] presents to [facility] for baseline mammogram and pap smear.  Zina #[id number] from the language line, used for interpretation.  Clinical breast exam revealed bilateral breast to have a fibroglandular like pattern.  No dominant mass, skin changes, nipple discharge or lymphadenopathy noted.  Taught self breast awareness.  Specimen collected for a pap smear without difficulty.  Patient has been screened for eligibility.  She does not have any insurance, Medicare or Medicaid.  She also meets financial eligibility.  Hand-out given on the Affordable Care Act.  Risk Assessment    Risk Scores      03/21/2018   Last edited by: Orson Slick, CMA   5-year risk: 1 %   Lifetime risk: 11.6 %             Plan:     Screening mammogram ordered.  Specimen for pap sent to the lab.  Will follow up per [facility] protocol.

## 2018-03-21 NOTE — Patient Instructions (Signed)
Gave patient hand-out, Women Staying Healthy, Active and Well from [facility], with education on breast health, pap smears, heart and colon health. 

## 2018-03-22 ENCOUNTER — Ambulatory Visit: Payer: Self-pay | Admitting: Gerontology

## 2018-03-22 ENCOUNTER — Other Ambulatory Visit: Payer: Self-pay

## 2018-03-22 ENCOUNTER — Encounter: Payer: Self-pay | Admitting: Gerontology

## 2018-03-22 VITALS — BP 108/78 | HR 82 | Wt 174.3 lb

## 2018-03-22 DIAGNOSIS — B353 Tinea pedis: Secondary | ICD-10-CM

## 2018-03-22 DIAGNOSIS — I10 Essential (primary) hypertension: Secondary | ICD-10-CM

## 2018-03-22 DIAGNOSIS — E7849 Other hyperlipidemia: Secondary | ICD-10-CM

## 2018-03-22 DIAGNOSIS — Z0000 Encounter for general adult medical examination without abnormal findings: Secondary | ICD-10-CM

## 2018-03-22 DIAGNOSIS — R319 Hematuria, unspecified: Secondary | ICD-10-CM

## 2018-03-22 MED ORDER — TERBINAFINE HCL 1 % EX CREA: 1.0000 "application " | TOPICAL_CREAM | Freq: Two times a day (BID) | CUTANEOUS | 1 refills | Status: DC

## 2018-03-22 MED ORDER — AMLODIPINE BESYLATE 5 MG PO TABS: ORAL_TABLET | ORAL | 2 refills | Status: DC

## 2018-03-22 NOTE — Progress Notes (Signed)
 Established Patient Office Visit  Subjective:  Patient ID: Nicole Lambert, female    DOB: 10/10/1975  Age: 43 y.o. MRN: [id number]  CC:  Chief Complaint  Patient presents with  . Follow-up    high blood pressure, itching between toes    HPI Nicole Lambert presents for follow up on hypertension and tinea pedis. She reports that she takes 2.5 mg Amlodipine daily, she denies dizziness, light headedness, headache and peripheral edema.  She reports 70 % improvement with using terbinafine for tinea pedis, but states that she continues to experience mild intermittent itching between the 4th and 5th toes of feet.  She states that she had Mammogram and pap smear done 03/21/18. She continues to follow up with Urologist for microscopic hematuria and Dr Harman recommended CT abdomen/pelvis and follow up with him afterwards. She denies hematuria, dysuria, flank pain, fever and chills. Otherwise, she denies chest pain, palpitation, shortness of breath and no further concerns.  Past Medical History:  Diagnosis Date  . Hypertension     History reviewed. No pertinent surgical history.  History reviewed. No pertinent family history.  Social History   Socioeconomic History  . Marital status: Married    Spouse name: Not on file  . Number of children: Not on file  . Years of education: Not on file  . Highest education level: Not on file  Occupational History  . Not on file  Social Needs  . Financial resource strain: Not on file  . Food insecurity:    Worry: Not on file    Inability: Not on file  . Transportation needs:    Medical: Not on file    Non-medical: Not on file  Tobacco Use  . Smoking status: Never Smoker  . Smokeless tobacco: Never Used  Substance and Sexual Activity  . Alcohol use: Never    Frequency: Never  . Drug use: Never  . Sexual activity: Not on file  Lifestyle  . Physical activity:    Days per week: Not on file    Minutes per session: Not on file  . Stress: Not on  file  Relationships  . Social connections:    Talks on phone: Not on file    Gets together: Not on file    Attends religious service: Not on file    Active member of club or organization: Not on file    Attends meetings of clubs or organizations: Not on file    Relationship status: Not on file  . Intimate partner violence:    Fear of current or ex partner: Not on file    Emotionally abused: Not on file    Physically abused: Not on file    Forced sexual activity: Not on file  Other Topics Concern  . Not on file  Social History Narrative  . Not on file    Outpatient Medications Prior to Visit  Medication Sig Dispense Refill  . amLODipine (NORVASC) 5 MG tablet TAKE 1/2 TABLET (2.5MG) BY MOUTH EVERY DAY 30 tablet 0  . terbinafine (LAMISIL AT) 1 % cream Apply 1 application topically 2 (two) times daily. Apply between 4th and 5th great toes bid  Until healed 30 g 0   No facility-administered medications prior to visit.     No Known Allergies  ROS Review of Systems  Constitutional: Negative.   HENT: Negative.   Eyes: Negative.   Respiratory: Negative.   Cardiovascular: Negative.   Genitourinary: Negative.   Musculoskeletal: Negative.   Skin: Color change:   tinea pedis to 4th and 5th toes of feet.  Neurological: Negative.   Psychiatric/Behavioral: Negative.       Objective:    Physical Exam  Constitutional: She is oriented to person, place, and time. She appears well-developed and well-nourished.  HENT:  Head: Normocephalic and atraumatic.  Eyes: Pupils are equal, round, and reactive to light. EOM are normal.  Neck: Normal range of motion.  Cardiovascular: Normal rate and regular rhythm.  Pulmonary/Chest: Effort normal and breath sounds normal.  Abdominal: Soft. Bowel sounds are normal.  Musculoskeletal: Normal range of motion.  Neurological: She is alert and oriented to person, place, and time.  Skin: Skin is warm.  greyish discoloration between 4th and 5th toes of  feet due to tinea pedis  Psychiatric: She has a normal mood and affect. Her behavior is normal. Judgment and thought content normal.    BP 108/78 (BP Location: Left Arm, Patient Position: Sitting)   Pulse 82   Wt 174 lb 4.8 oz (79.1 kg)   SpO2 99%   BMI 30.63 kg/m  Wt Readings from Last 3 Encounters:  03/22/18 174 lb 4.8 oz (79.1 kg)  03/21/18 173 lb 3.2 oz (78.6 kg)  01/18/18 171 lb 14.4 oz (78 kg)   She gained 3 pounds in 2 months, was encouraged to cut back caloric intake, exercise 30 minutes daily and lose weight.  Health Maintenance Due  Topic Date Due  . HIV Screening  03/12/1988  . TETANUS/TDAP  03/12/1992  . PAP SMEAR-Modifier  03/12/1994   She reports having Mammogram and Pap smear done 03/21/18. There are no preventive care reminders to display for this patient.  Lab Results  Component Value Date   TSH 1.620 12/06/2017   Lab Results  Component Value Date   WBC 6.2 12/06/2017   HGB 13.7 12/06/2017   HCT 39.8 12/06/2017   MCV 86 12/06/2017   PLT 221 12/06/2017   Lab Results  Component Value Date   NA 139 12/06/2017   K 4.6 12/06/2017   CO2 24 12/06/2017   GLUCOSE 92 12/06/2017   BUN 7 12/06/2017   CREATININE 0.66 12/06/2017   BILITOT 0.5 12/06/2017   ALKPHOS 62 12/06/2017   AST 10 12/06/2017   ALT 9 12/06/2017   PROT 6.9 12/06/2017   ALBUMIN 4.2 12/06/2017   CALCIUM 9.6 12/06/2017   Lab Results  Component Value Date   CHOL 177 03/14/2018   Lab Results  Component Value Date   HDL 31 (L) 03/14/2018   She was encouraged to exercise 30 minutes daily and continue on low fat low cholesterol diet. Lab Results  Component Value Date   LDLCALC 79 03/14/2018   Lab Results  Component Value Date   TRIG 336 (H) 03/14/2018   Lab Results  Component Value Date   CHOLHDL 5.7 (H) 03/14/2018   Lab Results  Component Value Date   HGBA1C 5.2 12/06/2017      Assessment & Plan:   Problem List Items Addressed This Visit    None    Visit Diagnoses     Tinea pedis of both feet    -  Primary   Relevant Medications   terbinafine (LAMISIL AT) 1 % cream   Hypertension, unspecified type       Relevant Medications   amLODipine (NORVASC) 5 MG tablet   Other Relevant Orders   Comp Met (CMET)   Hematuria, unspecified type       Hyperlipidemia due to dietary fat intake         Relevant Medications   amLODipine (NORVASC) 5 MG tablet   Other Relevant Orders   Lipid panel   Health care maintenance       Relevant Orders   CBC w/Diff   HgB A1c      Meds ordered this encounter  Medications  . amLODipine (NORVASC) 5 MG tablet    Sig: TAKE 1/2 TABLET (2.5MG) BY MOUTH EVERY DAY    Dispense:  30 tablet    Refill:  2  . terbinafine (LAMISIL AT) 1 % cream    Sig: Apply 1 application topically 2 (two) times daily. Apply between 4th and 5th toes bid  Until healed    Dispense:  30 g    Refill:  1   Assessment and Plan   1. Hypertension, unspecified type Hypertension is well controlled, she was educated on medication side effects and to notify provider, and was encouraged to lose weight and exercise 30 minutes daily. Will consider discontinuation of 2.5 mg Amlodipine with normal blood pressure during next visit. Will recheck labs for cardiovascular risk factors prior to next office visit. - amLODipine (NORVASC) 5 MG tablet; TAKE 1/2 TABLET (2.5MG) BY MOUTH EVERY DAY  Dispense: 30 tablet; Refill: 2 - Comp Met (CMET); Future  2. Tinea pedis of both feet - She was encouraged to continue applying Lamisil to 4th and 5th toes as ordered and notify provider for worsening symptoms or side effects. - terbinafine (LAMISIL AT) 1 % cream; Apply 1 application topically 2 (two) times daily. Apply between 4th and 5th toes bid  Until healed  Dispense: 30 g; Refill: 1  3. Hematuria, unspecified type - She continues to follow up with Urologist Dr Harman and has CT abdomen and pelvis scheduled.  4. Hyperlipidemia due to dietary fat intake - She was encouraged to  continue on low fat low cholesterol diet, exercise 30 minutes daily and lose weight. - Lipid panel; Future  5. Health care maintenance  - CBC w/Diff; Future - HgB A1c; Future  Follow-up: Return in about 4 months (around 07/21/2018), or if symptoms worsen or fail to improve.    Chioma E Iloabachie, NP 

## 2018-03-22 NOTE — Patient Instructions (Signed)
Heart-Healthy Eating Plan Many factors influence your heart (coronary) health, including eating and exercise habits. Coronary risk increases with abnormal blood fat (lipid) levels. Heart-healthy meal planning includes limiting unhealthy fats, increasing healthy fats, and making other diet and lifestyle changes. What is my plan? Your health care provider may recommend that you:  Limit your fat intake to _________% or less of your total calories each day.  Limit your saturated fat intake to _________% or less of your total calories each day.  Limit the amount of cholesterol in your diet to less than _________ mg per day. What are tips for following this plan? Cooking Cook foods using methods other than frying. Baking, boiling, grilling, and broiling are all good options. Other ways to reduce fat include:  Removing the skin from poultry.  Removing all visible fats from meats.  Steaming vegetables in water or broth. Meal planning   At meals, imagine dividing your plate into fourths: ? Fill one-half of your plate with vegetables and green salads. ? Fill one-fourth of your plate with whole grains. ? Fill one-fourth of your plate with lean protein foods.  Eat 4-5 servings of vegetables per day. One serving equals 1 cup raw or cooked vegetable, or 2 cups raw leafy greens.  Eat 4-5 servings of fruit per day. One serving equals 1 medium whole fruit,  cup dried fruit,  cup fresh, frozen, or canned fruit, or  cup 100% fruit juice.  Eat more foods that contain soluble fiber. Examples include apples, broccoli, carrots, beans, peas, and barley. Aim to get 25-30 g of fiber per day.  Increase your consumption of legumes, nuts, and seeds to 4-5 servings per week. One serving of dried beans or legumes equals  cup cooked, 1 serving of nuts is  cup, and 1 serving of seeds equals 1 tablespoon. Fats  Choose healthy fats more often. Choose monounsaturated and polyunsaturated fats, such as olive and  canola oils, flaxseeds, walnuts, almonds, and seeds.  Eat more omega-3 fats. Choose salmon, mackerel, sardines, tuna, flaxseed oil, and ground flaxseeds. Aim to eat fish at least 2 times each week.  Check food labels carefully to identify foods with trans fats or high amounts of saturated fat.  Limit saturated fats. These are found in animal products, such as meats, butter, and cream. Plant sources of saturated fats include palm oil, palm kernel oil, and coconut oil.  Avoid foods with partially hydrogenated oils in them. These contain trans fats. Examples are stick margarine, some tub margarines, cookies, crackers, and other baked goods.  Avoid fried foods. General information  Eat more home-cooked food and less restaurant, buffet, and fast food.  Limit or avoid alcohol.  Limit foods that are high in starch and sugar.  Lose weight if you are overweight. Losing just 5-10% of your body weight can help your overall health and prevent diseases such as diabetes and heart disease.  Monitor your salt (sodium) intake, especially if you have high blood pressure. Talk with your health care provider about your sodium intake.  Try to incorporate more vegetarian meals weekly. What foods can I eat? Fruits All fresh, canned (in natural juice), or frozen fruits. Vegetables Fresh or frozen vegetables (raw, steamed, roasted, or grilled). Green salads. Grains Most grains. Choose whole wheat and whole grains most of the time. Rice and pasta, including brown rice and pastas made with whole wheat. Meats and other proteins Lean, well-trimmed beef, veal, pork, and lamb. Chicken and turkey without skin. All fish and shellfish. Wild   duck, rabbit, pheasant, and venison. Egg whites or low-cholesterol egg substitutes. Dried beans, peas, lentils, and tofu. Seeds and most nuts. Dairy Low-fat or nonfat cheeses, including ricotta and mozzarella. Skim or 1% milk (liquid, powdered, or evaporated). Buttermilk made  with low-fat milk. Nonfat or low-fat yogurt. Fats and oils Non-hydrogenated (trans-free) margarines. Vegetable oils, including soybean, sesame, sunflower, olive, peanut, safflower, corn, canola, and cottonseed. Salad dressings or mayonnaise made with a vegetable oil. Beverages Water (mineral or sparkling). Coffee and tea. Diet carbonated beverages. Sweets and desserts Sherbet, gelatin, and fruit ice. Small amounts of dark chocolate. Limit all sweets and desserts. Seasonings and condiments All seasonings and condiments. The items listed above may not be a complete list of foods and beverages you can eat. Contact a dietitian for more options. What foods are not recommended? Fruits Canned fruit in heavy syrup. Fruit in cream or butter sauce. Fried fruit. Limit coconut. Vegetables Vegetables cooked in cheese, cream, or butter sauce. Fried vegetables. Grains Breads made with saturated or trans fats, oils, or whole milk. Croissants. Sweet rolls. Donuts. High-fat crackers, such as cheese crackers. Meats and other proteins Fatty meats, such as hot dogs, ribs, sausage, bacon, rib-eye roast or steak. High-fat deli meats, such as salami and bologna. Caviar. Domestic duck and goose. Organ meats, such as liver. Dairy Cream, sour cream, cream cheese, and creamed cottage cheese. Whole milk cheeses. Whole or 2% milk (liquid, evaporated, or condensed). Whole buttermilk. Cream sauce or high-fat cheese sauce. Whole-milk yogurt. Fats and oils Meat fat, or shortening. Cocoa butter, hydrogenated oils, palm oil, coconut oil, palm kernel oil. Solid fats and shortenings, including bacon fat, salt pork, lard, and butter. Nondairy cream substitutes. Salad dressings with cheese or sour cream. Beverages Regular sodas and any drinks with added sugar. Sweets and desserts Frosting. Pudding. Cookies. Cakes. Pies. Milk chocolate or white chocolate. Buttered syrups. Full-fat ice cream or ice cream drinks. The items listed  above may not be a complete list of foods and beverages to avoid. Contact a dietitian for more information. Summary  Heart-healthy meal planning includes limiting unhealthy fats, increasing healthy fats, and making other diet and lifestyle changes.  Lose weight if you are overweight. Losing just 5-10% of your body weight can help your overall health and prevent diseases such as diabetes and heart disease.  Focus on eating a balance of foods, including fruits and vegetables, low-fat or nonfat dairy, lean protein, nuts and legumes, whole grains, and heart-healthy oils and fats. This information is not intended to replace advice given to you by your health care provider. Make sure you discuss any questions you have with your health care provider. Document Released: 11/10/2007 Document Revised: 03/10/2017 Document Reviewed: 03/10/2017 Elsevier Interactive Patient Education  2019 [place] DASH stands for "Dietary Approaches to Stop Hypertension." The DASH eating plan is a healthy eating plan that has been shown to reduce high blood pressure (hypertension). It may also reduce your risk for type 2 diabetes, heart disease, and stroke. The DASH eating plan may also help with weight loss. What are tips for following this plan?  General guidelines  Avoid eating more than 2,300 mg (milligrams) of salt (sodium) a day. If you have hypertension, you may need to reduce your sodium intake to 1,500 mg a day.  Limit alcohol intake to no more than 1 drink a day for nonpregnant women and 2 drinks a day for men. One drink equals 12 oz of beer, 5 oz of wine, or 1 oz  of hard liquor.  Work with your health care provider to maintain a healthy body weight or to lose weight. Ask what an ideal weight is for you.  Get at least 30 minutes of exercise that causes your heart to beat faster (aerobic exercise) most days of the week. Activities may include walking, swimming, or biking.  Work with your  health care provider or diet and nutrition specialist (dietitian) to adjust your eating plan to your individual calorie needs. Reading food labels   Check food labels for the amount of sodium per serving. Choose foods with less than 5 percent of the Daily Value of sodium. Generally, foods with less than 300 mg of sodium per serving fit into this eating plan.  To find whole grains, look for the word "whole" as the first word in the ingredient list. Shopping  Buy products labeled as "low-sodium" or "no salt added."  Buy fresh foods. Avoid canned foods and premade or frozen meals. Cooking  Avoid adding salt when cooking. Use salt-free seasonings or herbs instead of table salt or sea salt. Check with your health care provider or pharmacist before using salt substitutes.  Do not fry foods. Cook foods using healthy methods such as baking, boiling, grilling, and broiling instead.  Cook with heart-healthy oils, such as olive, canola, soybean, or sunflower oil. Meal planning  Eat a balanced diet that includes: ? 5 or more servings of fruits and vegetables each day. At each meal, try to fill half of your plate with fruits and vegetables. ? Up to 6-8 servings of whole grains each day. ? Less than 6 oz of lean meat, poultry, or fish each day. A 3-oz serving of meat is about the same size as a deck of cards. One egg equals 1 oz. ? 2 servings of low-fat dairy each day. ? A serving of nuts, seeds, or beans 5 times each week. ? Heart-healthy fats. Healthy fats called Omega-3 fatty acids are found in foods such as flaxseeds and coldwater fish, like sardines, salmon, and mackerel.  Limit how much you eat of the following: ? Canned or prepackaged foods. ? Food that is high in trans fat, such as fried foods. ? Food that is high in saturated fat, such as fatty meat. ? Sweets, desserts, sugary drinks, and other foods with added sugar. ? Full-fat dairy products.  Do not salt foods before eating.  Try  to eat at least 2 vegetarian meals each week.  Eat more home-cooked food and less restaurant, buffet, and fast food.  When eating at a restaurant, ask that your food be prepared with less salt or no salt, if possible. What foods are recommended? The items listed may not be a complete list. Talk with your dietitian about what dietary choices are best for you. Grains Whole-grain or whole-wheat bread. Whole-grain or whole-wheat pasta. Brown rice. [place]. Bulgur. Whole-grain and low-sodium cereals. Pita bread. Low-fat, low-sodium crackers. Whole-wheat flour tortillas. Vegetables Fresh or frozen vegetables (raw, steamed, roasted, or grilled). Low-sodium or reduced-sodium tomato and vegetable juice. Low-sodium or reduced-sodium tomato sauce and tomato paste. Low-sodium or reduced-sodium canned vegetables. Fruits All fresh, dried, or frozen fruit. Canned fruit in natural juice (without added sugar). Meat and other protein foods Skinless chicken or [place]. Ground chicken or [place]. Pork with fat trimmed off. Fish and seafood. Egg whites. Dried beans, peas, or lentils. Unsalted nuts, nut butters, and seeds. Unsalted canned beans. Lean cuts of beef with fat trimmed off. Low-sodium, lean deli meat. Dairy Low-fat (  1%) or fat-free (skim) milk. Fat-free, low-fat, or reduced-fat cheeses. Nonfat, low-sodium ricotta or cottage cheese. Low-fat or nonfat yogurt. Low-fat, low-sodium cheese. Fats and oils Soft margarine without trans fats. Vegetable oil. Low-fat, reduced-fat, or light mayonnaise and salad dressings (reduced-sodium). Canola, safflower, olive, soybean, and sunflower oils. Avocado. Seasoning and other foods Herbs. Spices. Seasoning mixes without salt. Unsalted popcorn and pretzels. Fat-free sweets. What foods are not recommended? The items listed may not be a complete list. Talk with your dietitian about what dietary choices are best for you. Grains Baked goods made with fat, such as  croissants, muffins, or some breads. Dry pasta or rice meal packs. Vegetables Creamed or fried vegetables. Vegetables in a cheese sauce. Regular canned vegetables (not low-sodium or reduced-sodium). Regular canned tomato sauce and paste (not low-sodium or reduced-sodium). Regular tomato and vegetable juice (not low-sodium or reduced-sodium). Angie Fava. Olives. Fruits Canned fruit in a light or heavy syrup. Fried fruit. Fruit in cream or butter sauce. Meat and other protein foods Fatty cuts of meat. Ribs. Fried meat. Berniece Salines. Sausage. Bologna and other processed lunch meats. Salami. Fatback. Hotdogs. Bratwurst. Salted nuts and seeds. Canned beans with added salt. Canned or smoked fish. Whole eggs or egg yolks. Chicken or [place] with skin. Dairy Whole or 2% milk, cream, and half-and-half. Whole or full-fat cream cheese. Whole-fat or sweetened yogurt. Full-fat cheese. Nondairy creamers. Whipped toppings. Processed cheese and cheese spreads. Fats and oils Butter. Stick margarine. Lard. Shortening. Ghee. Bacon fat. Tropical oils, such as coconut, palm kernel, or palm oil. Seasoning and other foods Salted popcorn and pretzels. Onion salt, garlic salt, seasoned salt, table salt, and sea salt. Worcestershire sauce. Tartar sauce. Barbecue sauce. Teriyaki sauce. Soy sauce, including reduced-sodium. Steak sauce. Canned and packaged gravies. Fish sauce. Oyster sauce. Cocktail sauce. Horseradish that you find on the shelf. Ketchup. Mustard. Meat flavorings and tenderizers. Bouillon cubes. Hot sauce and Tabasco sauce. Premade or packaged marinades. Premade or packaged taco seasonings. Relishes. Regular salad dressings. Where to find more information:  National Heart, Lung, and [place]: https://wilson-eaton.com/  American Heart Association: www.heart.org Summary  The DASH eating plan is a healthy eating plan that has been shown to reduce high blood pressure (hypertension). It may also reduce your risk for type 2  diabetes, heart disease, and stroke.  With the DASH eating plan, you should limit salt (sodium) intake to 2,300 mg a day. If you have hypertension, you may need to reduce your sodium intake to 1,500 mg a day.  When on the DASH eating plan, aim to eat more fresh fruits and vegetables, whole grains, lean proteins, low-fat dairy, and heart-healthy fats.  Work with your health care provider or diet and nutrition specialist (dietitian) to adjust your eating plan to your individual calorie needs. This information is not intended to replace advice given to you by your health care provider. Make sure you discuss any questions you have with your health care provider. Document Released: 01/20/2011 Document Revised: 01/25/2016 Document Reviewed: 01/25/2016 Elsevier Interactive Patient Education  2019 [employer].

## 2018-03-26 LAB — PAP LB AND HPV HIGH-RISK: HPV, HIGH-RISK: NEGATIVE

## 2018-03-27 ENCOUNTER — Ambulatory Visit
Admission: RE | Admit: 2018-03-27 | Discharge: 2018-03-27 | Disposition: A | Discharge status: Home / Self Care | Payer: Self-pay | Source: Ambulatory Visit | Attending: Gerontology | Admitting: Gerontology

## 2018-03-27 DIAGNOSIS — R319 Hematuria, unspecified: Secondary | ICD-10-CM | POA: Insufficient documentation

## 2018-03-27 LAB — POCT I-STAT CREATININE: Creatinine, Ser: 0.6 mg/dL (ref 0.44–1.00)

## 2018-03-27 MED ORDER — IOHEXOL 350 MG/ML SOLN: 125.0000 mL | Freq: Once | INTRAVENOUS | Status: DC | PRN

## 2018-03-27 MED ORDER — IOHEXOL 300 MG/ML  SOLN
125.0000 mL | Freq: Once | INTRAMUSCULAR | Status: AC | PRN
  Administered 2018-03-27: 125 mL via INTRAVENOUS

## 2018-03-28 ENCOUNTER — Encounter: Payer: Self-pay | Admitting: Urology

## 2018-04-02 ENCOUNTER — Encounter: Payer: Self-pay | Admitting: *Deleted

## 2018-04-02 NOTE — Progress Notes (Signed)
Letter mailed to inform patient of her normal mammogram and pap results.  Next mammogram in one year and pap in 5 years.  [facility] to [place].

## 2018-04-18 ENCOUNTER — Other Ambulatory Visit: Payer: Self-pay

## 2018-04-18 DIAGNOSIS — E7849 Other hyperlipidemia: Secondary | ICD-10-CM

## 2018-04-18 DIAGNOSIS — I10 Essential (primary) hypertension: Secondary | ICD-10-CM

## 2018-04-18 DIAGNOSIS — Z0000 Encounter for general adult medical examination without abnormal findings: Secondary | ICD-10-CM

## 2018-04-19 LAB — COMPREHENSIVE METABOLIC PANEL
ALT: 15 IU/L (ref 0–32)
AST: 16 IU/L (ref 0–40)
Albumin/Globulin Ratio: 1.3 (ref 1.2–2.2)
Albumin: 3.9 g/dL (ref 3.8–4.8)
Alkaline Phosphatase: 71 IU/L (ref 39–117)
BILIRUBIN TOTAL: 0.5 mg/dL (ref 0.0–1.2)
BUN/Creatinine Ratio: 11 (ref 9–23)
BUN: 8 mg/dL (ref 6–24)
CO2: 24 mmol/L (ref 20–29)
Calcium: 9.7 mg/dL (ref 8.7–10.2)
Chloride: 101 mmol/L (ref 96–106)
Creatinine, Ser: 0.7 mg/dL (ref 0.57–1.00)
GFR calc Af Amer: 121 mL/min/{1.73_m2} (ref >59)
GFR calc non Af Amer: 105 mL/min/{1.73_m2} (ref >59)
GLOBULIN, TOTAL: 2.9 g/dL (ref 1.5–4.5)
Glucose: 110 mg/dL — ABNORMAL HIGH (ref 65–99)
Potassium: 3.7 mmol/L (ref 3.5–5.2)
SODIUM: 140 mmol/L (ref 134–144)
Total Protein: 6.8 g/dL (ref 6.0–8.5)

## 2018-04-19 LAB — CBC WITH DIFFERENTIAL/PLATELET
BASOS ABS: 0 10*3/uL (ref 0.0–0.2)
Basos: 0 %
EOS (ABSOLUTE): 0.1 10*3/uL (ref 0.0–0.4)
Eos: 1 %
Hematocrit: 38.7 % (ref 34.0–46.6)
Hemoglobin: 13.3 g/dL (ref 11.1–15.9)
Immature Grans (Abs): 0 10*3/uL (ref 0.0–0.1)
Immature Granulocytes: 0 %
Lymphocytes Absolute: 1.7 10*3/uL (ref 0.7–3.1)
Lymphs: 24 %
MCH: 29.4 pg (ref 26.6–33.0)
MCHC: 34.4 g/dL (ref 31.5–35.7)
MCV: 85 fL (ref 79–97)
Monocytes Absolute: 0.4 10*3/uL (ref 0.1–0.9)
Monocytes: 5 %
Neutrophils Absolute: 5.1 10*3/uL (ref 1.4–7.0)
Neutrophils: 70 %
Platelets: 258 10*3/uL (ref 150–450)
RBC: 4.53 x10E6/uL (ref 3.77–5.28)
RDW: 12.5 % (ref 11.7–15.4)
WBC: 7.3 10*3/uL (ref 3.4–10.8)

## 2018-04-19 LAB — HEMOGLOBIN A1C
ESTIMATED AVERAGE GLUCOSE: 111 mg/dL
Hgb A1c MFr Bld: 5.5 % (ref 4.8–5.6)

## 2018-04-19 LAB — LIPID PANEL
Chol/HDL Ratio: 5.1 ratio — ABNORMAL HIGH (ref 0.0–4.4)
Cholesterol, Total: 180 mg/dL (ref 100–199)
HDL: 35 mg/dL — ABNORMAL LOW (ref >39)
LDL Calculated: 84 mg/dL (ref 0–99)
Triglycerides: 307 mg/dL — ABNORMAL HIGH (ref 0–149)
VLDL Cholesterol Cal: 61 mg/dL — ABNORMAL HIGH (ref 5–40)

## 2018-04-25 ENCOUNTER — Encounter: Payer: Self-pay | Admitting: Urology

## 2018-07-12 ENCOUNTER — Telehealth: Payer: Self-pay

## 2018-07-12 NOTE — Telephone Encounter (Signed)
Nicole Lambert called requesting refill on amlodipine. Directions state patient should be taking 1/2 tablet per day. I explained to patients husband 1 bottle of 30 pills should last 60 days. He says she has been taking 1 tablet per day. They will call medication management for refill request.

## 2018-07-17 ENCOUNTER — Other Ambulatory Visit: Payer: Self-pay

## 2018-07-17 DIAGNOSIS — I10 Essential (primary) hypertension: Secondary | ICD-10-CM

## 2018-07-17 MED ORDER — AMLODIPINE BESYLATE 5 MG PO TABS: ORAL_TABLET | ORAL | 0 refills | Status: DC

## 2018-07-18 ENCOUNTER — Other Ambulatory Visit: Payer: Self-pay

## 2018-07-26 ENCOUNTER — Ambulatory Visit: Payer: Self-pay | Admitting: Gerontology

## 2018-08-03 ENCOUNTER — Other Ambulatory Visit: Payer: Self-pay | Admitting: Gerontology

## 2018-08-03 DIAGNOSIS — R319 Hematuria, unspecified: Secondary | ICD-10-CM

## 2018-08-03 DIAGNOSIS — E7849 Other hyperlipidemia: Secondary | ICD-10-CM

## 2018-08-08 ENCOUNTER — Other Ambulatory Visit: Payer: Self-pay

## 2018-08-08 DIAGNOSIS — R319 Hematuria, unspecified: Secondary | ICD-10-CM

## 2018-08-08 DIAGNOSIS — E7849 Other hyperlipidemia: Secondary | ICD-10-CM

## 2018-08-09 ENCOUNTER — Ambulatory Visit: Payer: Self-pay | Admitting: Gerontology

## 2018-08-09 LAB — LIPID PANEL
Chol/HDL Ratio: 5.2 ratio — ABNORMAL HIGH (ref 0.0–4.4)
Cholesterol, Total: 196 mg/dL (ref 100–199)
HDL: 38 mg/dL — ABNORMAL LOW (ref >39)
LDL Calculated: 101 mg/dL — ABNORMAL HIGH (ref 0–99)
Triglycerides: 285 mg/dL — ABNORMAL HIGH (ref 0–149)
VLDL Cholesterol Cal: 57 mg/dL — ABNORMAL HIGH (ref 5–40)

## 2018-08-09 LAB — URINALYSIS
Bilirubin, UA: NEGATIVE
Glucose, UA: NEGATIVE
Ketones, UA: NEGATIVE
Leukocytes,UA: NEGATIVE
Nitrite, UA: NEGATIVE
Protein,UA: NEGATIVE
Specific Gravity, UA: 1.008 (ref 1.005–1.030)
Urobilinogen, Ur: 0.2 mg/dL (ref 0.2–1.0)
pH, UA: 5 (ref 5.0–7.5)

## 2018-08-14 ENCOUNTER — Ambulatory Visit: Payer: Self-pay | Admitting: Gerontology

## 2018-08-14 ENCOUNTER — Other Ambulatory Visit: Payer: Self-pay

## 2018-08-14 VITALS — BP 120/81 | HR 81 | Wt 172.0 lb

## 2018-08-14 DIAGNOSIS — N6459 Other signs and symptoms in breast: Secondary | ICD-10-CM

## 2018-08-14 DIAGNOSIS — I10 Essential (primary) hypertension: Secondary | ICD-10-CM

## 2018-08-14 DIAGNOSIS — E785 Hyperlipidemia, unspecified: Secondary | ICD-10-CM

## 2018-08-14 DIAGNOSIS — R319 Hematuria, unspecified: Secondary | ICD-10-CM

## 2018-08-14 MED ORDER — BLOOD PRESSURE KIT: 1.0000 | PACK | Freq: Two times a day (BID) | 0 refills | Status: DC

## 2018-08-14 NOTE — Progress Notes (Signed)
Established Patient Office Visit  Subjective:  Patient ID: Nicole Lambert, female    DOB: Jun 14, 1975  Age: 43 y.o. MRN: [id number]  CC:  Chief Complaint  Patient presents with  . Follow-up    high blood pressure    HPI Nicole Lambert presents for follow up of hypertension, elevated lipids,heamturia and Tinea pedis. She reports that she experiences light headedness and tiredness for 4-6 hours after taking amlodipine. She reports that it started 3 weeks ago and she does not check blood pressure at home. She denies blurry vision, vertigo, chest pain, palpitation or peripheral edema. She also states that the tinea pedis in between her toes has resolved. Currently, she c/o noticing  lumps to bilateral breast. She denies breast pain and nipple discharge. She had mammogram done on 03/21/18 and it showed no mammographic evidence of malignancy. She has being having hematuria in her urine sample and unable to continue her follow up visits with [facility] Urologist Dr Collins [place] due to the Pandemic. She denies gross hematuria, dysuria and flank pain. Lipid panel done 6 days, Triglycerides 285, HDL 38, VLDL 57 and LDL 101 mg/dl. Otherwise, she denies fever, chills and no further concerns.  Past Medical History:  Diagnosis Date  . Hypertension     No past surgical history on file.  No family history on file.  Social History   Socioeconomic History  . Marital status: Married    Spouse name: Not on file  . Number of children: Not on file  . Years of education: Not on file  . Highest education level: Not on file  Occupational History  . Not on file  Social Needs  . Financial resource strain: Not on file  . Food insecurity    Worry: Not on file    Inability: Not on file  . Transportation needs    Medical: Not on file    Non-medical: Not on file  Tobacco Use  . Smoking status: Never Smoker  . Smokeless tobacco: Never Used  Substance and Sexual Activity  . Alcohol use: Never    Frequency:  Never  . Drug use: Never  . Sexual activity: Not on file  Lifestyle  . Physical activity    Days per week: Not on file    Minutes per session: Not on file  . Stress: Not on file  Relationships  . Social Herbalist on phone: Not on file    Gets together: Not on file    Attends religious service: Not on file    Active member of club or organization: Not on file    Attends meetings of clubs or organizations: Not on file    Relationship status: Not on file  . Intimate partner violence    Fear of current or ex partner: Not on file    Emotionally abused: Not on file    Physically abused: Not on file    Forced sexual activity: Not on file  Other Topics Concern  . Not on file  Social History Narrative  . Not on file    Outpatient Medications Prior to Visit  Medication Sig Dispense Refill  . amLODipine (NORVASC) 5 MG tablet TAKE 1/2 TABLET (2.5MG) BY MOUTH EVERY DAY 15 tablet 0  . terbinafine (LAMISIL AT) 1 % cream Apply 1 application topically 2 (two) times daily. Apply between 4th and 5th toes bid  Until healed (Patient not taking: Reported on 08/14/2018) 30 g 1   No facility-administered medications prior to  visit.     No Known Allergies  ROS Review of Systems  Constitutional: Positive for fatigue (intermittent).  HENT: Negative.   Respiratory: Negative.   Cardiovascular: Negative.   Gastrointestinal: Negative.   Genitourinary: Negative.   Musculoskeletal: Negative.   Skin: Negative.   Neurological: Positive for light-headedness (intermittent).  Hematological: Negative.   Psychiatric/Behavioral: Negative.       Objective:    Physical Exam  Constitutional: She is oriented to person, place, and time. She appears well-developed and well-nourished.  HENT:  Head: Normocephalic and atraumatic.  Eyes: Pupils are equal, round, and reactive to light. EOM are normal.  Neck: Normal range of motion. Neck supple.  Cardiovascular: Normal rate and regular rhythm.   Pulmonary/Chest: Effort normal and breath sounds normal. No breast swelling, tenderness or discharge.  Abdominal: Soft. Bowel sounds are normal.  Neurological: She is alert and oriented to person, place, and time.  Skin: Skin is warm and dry.  Psychiatric: She has a normal mood and affect. Her behavior is normal. Judgment and thought content normal.    BP 120/81 (BP Location: Left Arm, Patient Position: Sitting)   Pulse 81   Wt 172 lb (78 kg)   SpO2 100%   BMI 30.23 kg/m  Wt Readings from Last 3 Encounters:  08/14/18 172 lb (78 kg)  03/22/18 174 lb 4.8 oz (79.1 kg)  03/21/18 173 lb 3.2 oz (78.6 kg)     Health Maintenance Due  Topic Date Due  . HIV Screening  03/12/1988  . TETANUS/TDAP  03/12/1992    There are no preventive care reminders to display for this patient.  Lab Results  Component Value Date   TSH 1.620 12/06/2017   Lab Results  Component Value Date   WBC 7.3 04/18/2018   HGB 13.3 04/18/2018   HCT 38.7 04/18/2018   MCV 85 04/18/2018   PLT 258 04/18/2018   Lab Results  Component Value Date   NA 140 04/18/2018   K 3.7 04/18/2018   CO2 24 04/18/2018   GLUCOSE 110 (H) 04/18/2018   BUN 8 04/18/2018   CREATININE 0.70 04/18/2018   BILITOT 0.5 04/18/2018   ALKPHOS 71 04/18/2018   AST 16 04/18/2018   ALT 15 04/18/2018   PROT 6.8 04/18/2018   ALBUMIN 3.9 04/18/2018   CALCIUM 9.7 04/18/2018   Lab Results  Component Value Date   CHOL 196 08/08/2018   Lab Results  Component Value Date   HDL 38 (L) 08/08/2018   Lab Results  Component Value Date   LDLCALC 101 (H) 08/08/2018   Lab Results  Component Value Date   TRIG 285 (H) 08/08/2018   Lab Results  Component Value Date   CHOLHDL 5.2 (H) 08/08/2018   Lab Results  Component Value Date   HGBA1C 5.5 04/18/2018      Assessment & Plan:    1. Essential hypertension - Amlodipine 2.5 mg was discontinued, she was advised to monitor blood pressure bid, record and bring log to clinic in 2 weeks  . - Blood Pressure KIT; 1 kit by Does not apply route 2 (two) times daily.  Dispense: 1 kit; Refill: 0   2. Breast complaint - On palpation, it showed fibroglandular pattern to both breasts, no mass, nipple discharge, lymphadenopathy or pain. - She was advised to continue self breast exam and notify clinic for any changes.  3. Elevated lipids - She was encouraged on life style modifications and will recheck lipids in 2 months. -Low fat Diet, like low  fat dairy products eg skimmed milk -Avoid any fried food -Regular exercise/walk -Goal for Total Cholesterol is less than 200 -Goal for bad cholesterol LDL is less than 100 -Goal for Good cholesterol HDL is more than 45 -Goal for Triglyceride is less than 150   4. Hematuria, unspecified type - She was encouraged to complete charity care application for - Ambulatory referral to Urology    Follow-up: Return in about 2 weeks (around 08/28/2018), or ```.    Matyas Baisley Jerold Coombe, NP

## 2018-08-28 ENCOUNTER — Ambulatory Visit: Payer: Self-pay | Admitting: Gerontology

## 2018-08-28 ENCOUNTER — Encounter: Payer: Self-pay | Admitting: Gerontology

## 2018-08-28 ENCOUNTER — Other Ambulatory Visit: Payer: Self-pay

## 2018-08-28 VITALS — BP 122/78

## 2018-08-28 DIAGNOSIS — I10 Essential (primary) hypertension: Secondary | ICD-10-CM

## 2018-08-28 DIAGNOSIS — R319 Hematuria, unspecified: Secondary | ICD-10-CM

## 2018-08-28 DIAGNOSIS — E785 Hyperlipidemia, unspecified: Secondary | ICD-10-CM

## 2018-08-28 NOTE — Progress Notes (Signed)
Established Patient Office Visit  Subjective:  Patient ID: Nicole Lambert, female    DOB: 1975/12/10  Age: 43 y.o. MRN: [id number]  CC:  Chief Complaint  Patient presents with  . Follow-up    high blood pressure  Patient consents to telephone visit and 2 patient identifiers was used to identify patient.  HPI Nicole Lambert presents for follow up of hypertension and hematuria. She monitored her blood pressure for 2 weeks at home after Amlodipine was discontinued, and she reports that her SBP  ranges in the low to mid 120's and DBP in the mid to upper 70's. She denies light headedness, chest pain, palpitation and headache. She continues to comply with low salt diet and exercises as tolerated. She has a Urology follow up on August 12th for persistent hematuria. Otherwise she denies fever, chills and no further concerns.  Past Medical History:  Diagnosis Date  . Hypertension     No past surgical history on file.  No family history on file.  Social History   Socioeconomic History  . Marital status: Married    Spouse name: Not on file  . Number of children: Not on file  . Years of education: Not on file  . Highest education level: Not on file  Occupational History  . Not on file  Social Needs  . Financial resource strain: Not on file  . Food insecurity    Worry: Not on file    Inability: Not on file  . Transportation needs    Medical: Not on file    Non-medical: Not on file  Tobacco Use  . Smoking status: Never Smoker  . Smokeless tobacco: Never Used  Substance and Sexual Activity  . Alcohol use: Never    Frequency: Never  . Drug use: Never  . Sexual activity: Not on file  Lifestyle  . Physical activity    Days per week: Not on file    Minutes per session: Not on file  . Stress: Not on file  Relationships  . Social Herbalist on phone: Not on file    Gets together: Not on file    Attends religious service: Not on file    Active member of club or  organization: Not on file    Attends meetings of clubs or organizations: Not on file    Relationship status: Not on file  . Intimate partner violence    Fear of current or ex partner: Not on file    Emotionally abused: Not on file    Physically abused: Not on file    Forced sexual activity: Not on file  Other Topics Concern  . Not on file  Social History Narrative  . Not on file    Outpatient Medications Prior to Visit  Medication Sig Dispense Refill  . Blood Pressure KIT 1 kit by Does not apply route 2 (two) times daily. 1 kit 0   No facility-administered medications prior to visit.     No Known Allergies  ROS Review of Systems  Constitutional: Negative.   HENT: Negative.   Eyes: Negative.   Respiratory: Negative.   Cardiovascular: Negative.   Gastrointestinal: Negative.   Genitourinary: Negative.   Skin: Negative.   Neurological: Negative.   Psychiatric/Behavioral: Negative.       Objective:    Physical Exam No vital sign or PE was done. BP 122/78 Comment: pt stated Wt Readings from Last 3 Encounters:  08/14/18 172 lb (78 kg)  03/22/18 174 lb  4.8 oz (79.1 kg)  03/21/18 173 lb 3.2 oz (78.6 kg)     Health Maintenance Due  Topic Date Due  . HIV Screening  03/12/1988  . TETANUS/TDAP  03/12/1992    There are no preventive care reminders to display for this patient.  Lab Results  Component Value Date   TSH 1.620 12/06/2017   Lab Results  Component Value Date   WBC 7.3 04/18/2018   HGB 13.3 04/18/2018   HCT 38.7 04/18/2018   MCV 85 04/18/2018   PLT 258 04/18/2018   Lab Results  Component Value Date   NA 140 04/18/2018   K 3.7 04/18/2018   CO2 24 04/18/2018   GLUCOSE 110 (H) 04/18/2018   BUN 8 04/18/2018   CREATININE 0.70 04/18/2018   BILITOT 0.5 04/18/2018   ALKPHOS 71 04/18/2018   AST 16 04/18/2018   ALT 15 04/18/2018   PROT 6.8 04/18/2018   ALBUMIN 3.9 04/18/2018   CALCIUM 9.7 04/18/2018   Lab Results  Component Value Date   CHOL  196 08/08/2018   Lab Results  Component Value Date   HDL 38 (L) 08/08/2018   Lab Results  Component Value Date   LDLCALC 101 (H) 08/08/2018   Lab Results  Component Value Date   TRIG 285 (H) 08/08/2018   Lab Results  Component Value Date   CHOLHDL 5.2 (H) 08/08/2018   Lab Results  Component Value Date   HGBA1C 5.5 04/18/2018      Assessment & Plan:     1. Essential hypertension - She was advised to check her blood pressure and notify clinic is > 140/90. -Low salt DASH diet -Exercise regularly as tolerated -Check blood pressure at least once a week at home record, and bring log to clinic -Goal is less than 140/90 and normal blood pressure is 120/80   2. Hematuria, unspecified type - She was encouraged to follow up with Urologist Dr Diamantina [place] on 09/26/2018 for persistent hematuria.  3. Elevated lipids -Low fat Diet, like low fat dairy products eg skimmed milk -Avoid any fried food -Regular exercise/walk -Goal for Total Cholesterol is less than 200 -Goal for bad cholesterol LDL is less than 100 -Goal for Good cholesterol HDL is more than 45 -Goal for Triglyceride is less than 150 - Lipid panel;will be rechecked in 3 months.   Follow-up: Return in about 11 weeks (around 11/13/2018), or if symptoms worsen or fail to improve.    Katera Rybka Jerold Coombe, NP

## 2018-08-28 NOTE — Patient Instructions (Signed)
Fat and Cholesterol Restricted Eating Plan Getting too much fat and cholesterol in your diet may cause health problems. Choosing the right foods helps keep your fat and cholesterol at normal levels. This can keep you from getting certain diseases. Your doctor may recommend an eating plan that includes:  Total fat: ______% or less of total calories a day.  Saturated fat: ______% or less of total calories a day.  Cholesterol: less than _________mg a day.  Fiber: ______g a day. What are tips for following this plan? Meal planning  At meals, divide your plate into four equal parts: ? Fill one-half of your plate with vegetables and green salads. ? Fill one-fourth of your plate with whole grains. ? Fill one-fourth of your plate with low-fat (lean) protein foods.  Eat fish that is high in omega-3 fats at least two times a week. This includes mackerel, tuna, sardines, and salmon.  Eat foods that are high in fiber, such as whole grains, beans, apples, broccoli, carrots, peas, and barley. General tips   Work with your doctor to lose weight if you need to.  Avoid: ? Foods with added sugar. ? Fried foods. ? Foods with partially hydrogenated oils.  Limit alcohol intake to no more than 1 drink a day for nonpregnant women and 2 drinks a day for men. One drink equals 12 oz of beer, 5 oz of wine, or 1 oz of hard liquor. Reading food labels  Check food labels for: ? Trans fats. ? Partially hydrogenated oils. ? Saturated fat (g) in each serving. ? Cholesterol (mg) in each serving. ? Fiber (g) in each serving.  Choose foods with healthy fats, such as: ? Monounsaturated fats. ? Polyunsaturated fats. ? Omega-3 fats.  Choose grain products that have whole grains. Look for the word "whole" as the first word in the ingredient list. Cooking  Cook foods using low-fat methods. These include baking, boiling, grilling, and broiling.  Eat more home-cooked foods. Eat at restaurants and buffets  less often.  Avoid cooking using saturated fats, such as butter, cream, palm oil, palm kernel oil, and coconut oil. Recommended foods  Fruits  All fresh, canned (in natural juice), or frozen fruits. Vegetables  Fresh or frozen vegetables (raw, steamed, roasted, or grilled). Green salads. Grains  Whole grains, such as whole wheat or whole grain breads, crackers, cereals, and pasta. Unsweetened oatmeal, bulgur, barley, quinoa, or brown rice. Corn or whole wheat flour tortillas. Meats and other protein foods  Ground beef (85% or leaner), grass-fed beef, or beef trimmed of fat. Skinless chicken or [place]. Ground chicken or [place]. Pork trimmed of fat. All fish and seafood. Egg whites. Dried beans, peas, or lentils. Unsalted nuts or seeds. Unsalted canned beans. Nut butters without added sugar or oil. Dairy  Low-fat or nonfat dairy products, such as skim or 1% milk, 2% or reduced-fat cheeses, low-fat and fat-free ricotta or cottage cheese, or plain low-fat and nonfat yogurt. Fats and oils  Tub margarine without trans fats. Light or reduced-fat mayonnaise and salad dressings. Avocado. Olive, canola, sesame, or safflower oils. The items listed above may not be a complete list of foods and beverages you can eat. Contact a dietitian for more information. Foods to avoid Fruits  Canned fruit in heavy syrup. Fruit in cream or butter sauce. Fried fruit. Vegetables  Vegetables cooked in cheese, cream, or butter sauce. Fried vegetables. Grains  White bread. White pasta. White rice. Cornbread. Bagels, pastries, and croissants. Crackers and snack foods that contain trans fat  and hydrogenated oils. Meats and other protein foods  Fatty cuts of meat. Ribs, chicken wings, bacon, sausage, bologna, salami, chitterlings, fatback, hot dogs, bratwurst, and packaged lunch meats. Liver and organ meats. Whole eggs and egg yolks. Chicken and [place] with skin. Fried meat. Dairy  Whole or 2% milk, cream,  half-and-half, and cream cheese. Whole milk cheeses. Whole-fat or sweetened yogurt. Full-fat cheeses. Nondairy creamers and whipped toppings. Processed cheese, cheese spreads, and cheese curds. Beverages  Alcohol. Sugar-sweetened drinks such as sodas, lemonade, and fruit drinks. Fats and oils  Butter, stick margarine, lard, shortening, ghee, or bacon fat. Coconut, palm kernel, and palm oils. Sweets and desserts  Corn syrup, sugars, honey, and molasses. Candy. Jam and jelly. Syrup. Sweetened cereals. Cookies, pies, cakes, donuts, muffins, and ice cream. The items listed above may not be a complete list of foods and beverages you should avoid. Contact a dietitian for more information. Summary  Choosing the right foods helps keep your fat and cholesterol at normal levels. This can keep you from getting certain diseases.  At meals, fill one-half of your plate with vegetables and green salads.  Eat high-fiber foods, like whole grains, beans, apples, carrots, peas, and barley.  Limit added sugar, saturated fats, alcohol, and fried foods. This information is not intended to replace advice given to you by your health care provider. Make sure you discuss any questions you have with your health care provider. Document Released: 08/02/2011 Document Revised: 10/04/2017 Document Reviewed: 10/18/2016 Elsevier Patient Education  [place] DASH stands for "Dietary Approaches to Stop Hypertension." The DASH eating plan is a healthy eating plan that has been shown to reduce high blood pressure (hypertension). It may also reduce your risk for type 2 diabetes, heart disease, and stroke. The DASH eating plan may also help with weight loss. What are tips for following this plan?  General guidelines  Avoid eating more than 2,300 mg (milligrams) of salt (sodium) a day. If you have hypertension, you may need to reduce your sodium intake to 1,500 mg a day.  Limit alcohol intake to no  more than 1 drink a day for nonpregnant women and 2 drinks a day for men. One drink equals 12 oz of beer, 5 oz of wine, or 1 oz of hard liquor.  Work with your health care provider to maintain a healthy body weight or to lose weight. Ask what an ideal weight is for you.  Get at least 30 minutes of exercise that causes your heart to beat faster (aerobic exercise) most days of the week. Activities may include walking, swimming, or biking.  Work with your health care provider or diet and nutrition specialist (dietitian) to adjust your eating plan to your individual calorie needs. Reading food labels   Check food labels for the amount of sodium per serving. Choose foods with less than 5 percent of the Daily Value of sodium. Generally, foods with less than 300 mg of sodium per serving fit into this eating plan.  To find whole grains, look for the word "whole" as the first word in the ingredient list. Shopping  Buy products labeled as "low-sodium" or "no salt added."  Buy fresh foods. Avoid canned foods and premade or frozen meals. Cooking  Avoid adding salt when cooking. Use salt-free seasonings or herbs instead of table salt or sea salt. Check with your health care provider or pharmacist before using salt substitutes.  Do not fry foods. Cook foods using healthy methods such as  baking, boiling, grilling, and broiling instead.  Cook with heart-healthy oils, such as olive, canola, soybean, or sunflower oil. Meal planning  Eat a balanced diet that includes: ? 5 or more servings of fruits and vegetables each day. At each meal, try to fill half of your plate with fruits and vegetables. ? Up to 6-8 servings of whole grains each day. ? Less than 6 oz of lean meat, poultry, or fish each day. A 3-oz serving of meat is about the same size as a deck of cards. One egg equals 1 oz. ? 2 servings of low-fat dairy each day. ? A serving of nuts, seeds, or beans 5 times each week. ? Heart-healthy fats.  Healthy fats called Omega-3 fatty acids are found in foods such as flaxseeds and coldwater fish, like sardines, salmon, and mackerel.  Limit how much you eat of the following: ? Canned or prepackaged foods. ? Food that is high in trans fat, such as fried foods. ? Food that is high in saturated fat, such as fatty meat. ? Sweets, desserts, sugary drinks, and other foods with added sugar. ? Full-fat dairy products.  Do not salt foods before eating.  Try to eat at least 2 vegetarian meals each week.  Eat more home-cooked food and less restaurant, buffet, and fast food.  When eating at a restaurant, ask that your food be prepared with less salt or no salt, if possible. What foods are recommended? The items listed may not be a complete list. Talk with your dietitian about what dietary choices are best for you. Grains Whole-grain or whole-wheat bread. Whole-grain or whole-wheat pasta. Brown rice. [place]. Bulgur. Whole-grain and low-sodium cereals. Pita bread. Low-fat, low-sodium crackers. Whole-wheat flour tortillas. Vegetables Fresh or frozen vegetables (raw, steamed, roasted, or grilled). Low-sodium or reduced-sodium tomato and vegetable juice. Low-sodium or reduced-sodium tomato sauce and tomato paste. Low-sodium or reduced-sodium canned vegetables. Fruits All fresh, dried, or frozen fruit. Canned fruit in natural juice (without added sugar). Meat and other protein foods Skinless chicken or [place]. Ground chicken or [place]. Pork with fat trimmed off. Fish and seafood. Egg whites. Dried beans, peas, or lentils. Unsalted nuts, nut butters, and seeds. Unsalted canned beans. Lean cuts of beef with fat trimmed off. Low-sodium, lean deli meat. Dairy Low-fat (1%) or fat-free (skim) milk. Fat-free, low-fat, or reduced-fat cheeses. Nonfat, low-sodium ricotta or cottage cheese. Low-fat or nonfat yogurt. Low-fat, low-sodium cheese. Fats and oils Soft margarine without trans fats. Vegetable  oil. Low-fat, reduced-fat, or light mayonnaise and salad dressings (reduced-sodium). Canola, safflower, olive, soybean, and sunflower oils. Avocado. Seasoning and other foods Herbs. Spices. Seasoning mixes without salt. Unsalted popcorn and pretzels. Fat-free sweets. What foods are not recommended? The items listed may not be a complete list. Talk with your dietitian about what dietary choices are best for you. Grains Baked goods made with fat, such as croissants, muffins, or some breads. Dry pasta or rice meal packs. Vegetables Creamed or fried vegetables. Vegetables in a cheese sauce. Regular canned vegetables (not low-sodium or reduced-sodium). Regular canned tomato sauce and paste (not low-sodium or reduced-sodium). Regular tomato and vegetable juice (not low-sodium or reduced-sodium). Angie Fava. Olives. Fruits Canned fruit in a light or heavy syrup. Fried fruit. Fruit in cream or butter sauce. Meat and other protein foods Fatty cuts of meat. Ribs. Fried meat. Berniece Salines. Sausage. Bologna and other processed lunch meats. Salami. Fatback. Hotdogs. Bratwurst. Salted nuts and seeds. Canned beans with added salt. Canned or smoked fish. Whole eggs or egg yolks.  Chicken or [place] with skin. Dairy Whole or 2% milk, cream, and half-and-half. Whole or full-fat cream cheese. Whole-fat or sweetened yogurt. Full-fat cheese. Nondairy creamers. Whipped toppings. Processed cheese and cheese spreads. Fats and oils Butter. Stick margarine. Lard. Shortening. Ghee. Bacon fat. Tropical oils, such as coconut, palm kernel, or palm oil. Seasoning and other foods Salted popcorn and pretzels. Onion salt, garlic salt, seasoned salt, table salt, and sea salt. Worcestershire sauce. Tartar sauce. Barbecue sauce. Teriyaki sauce. Soy sauce, including reduced-sodium. Steak sauce. Canned and packaged gravies. Fish sauce. Oyster sauce. Cocktail sauce. Horseradish that you find on the shelf. Ketchup. Mustard. Meat flavorings and  tenderizers. Bouillon cubes. Hot sauce and Tabasco sauce. Premade or packaged marinades. Premade or packaged taco seasonings. Relishes. Regular salad dressings. Where to find more information:  National Heart, Lung, and [place]: https://wilson-eaton.com/  American Heart Association: www.heart.org Summary  The DASH eating plan is a healthy eating plan that has been shown to reduce high blood pressure (hypertension). It may also reduce your risk for type 2 diabetes, heart disease, and stroke.  With the DASH eating plan, you should limit salt (sodium) intake to 2,300 mg a day. If you have hypertension, you may need to reduce your sodium intake to 1,500 mg a day.  When on the DASH eating plan, aim to eat more fresh fruits and vegetables, whole grains, lean proteins, low-fat dairy, and heart-healthy fats.  Work with your health care provider or diet and nutrition specialist (dietitian) to adjust your eating plan to your individual calorie needs. This information is not intended to replace advice given to you by your health care provider. Make sure you discuss any questions you have with your health care provider. Document Released: 01/20/2011 Document Revised: 01/13/2017 Document Reviewed: 01/25/2016 Elsevier Patient Education  2020 [employer].

## 2018-09-04 ENCOUNTER — Telehealth: Payer: Self-pay | Admitting: Pharmacy Technician

## 2018-09-04 NOTE — Telephone Encounter (Signed)
Received 2020 proof of income.  Patient eligible to receive medication assistance at [facility] as long as eligibility requirements continue to be met.  [facility]

## 2018-09-26 ENCOUNTER — Ambulatory Visit: Payer: Self-pay | Admitting: Urology

## 2018-10-02 ENCOUNTER — Ambulatory Visit: Payer: Self-pay | Admitting: Gerontology

## 2018-10-02 ENCOUNTER — Encounter: Payer: Self-pay | Admitting: Gerontology

## 2018-10-02 ENCOUNTER — Other Ambulatory Visit: Payer: Self-pay

## 2018-10-02 VITALS — BP 125/77 | HR 82 | Ht 59.06 in | Wt 177.2 lb

## 2018-10-02 DIAGNOSIS — N6459 Other signs and symptoms in breast: Secondary | ICD-10-CM

## 2018-10-02 NOTE — Progress Notes (Signed)
Established Patient Office Visit  Subjective:  Patient ID: Nicole Lambert, female    DOB: 08-16-1975  Age: 43 y.o. MRN: [id number]  CC:  Chief Complaint  Patient presents with  . Follow-up    new lump in left breast, changes in size with her menstrual period    HPI Nicole Lambert presents for c/o lump at 12 o'clock spot on her left breast that increases in size 2 days prior and during her period, but the size decreases after her period. She reports that she experiences heaviness and pain to both breasts during her period and it resolves after her period. She reports that this symptoms have being going on for 1 month. She had mammogram done on 03/21/18 and it showed no mammographic evidence of malignancy. Also the breast tissue is heterogeneously dense, which may obscure small masses and there are bilateral rounded masses consistent with benign finding such as cysts, per Dr Miquel Dunn D. She denies nipple discharge, fever, chills and no further concerns.  Past Medical History:  Diagnosis Date  . Hypertension     No past surgical history on file.  No family history on file.  Social History   Socioeconomic History  . Marital status: Married    Spouse name: Not on file  . Number of children: Not on file  . Years of education: Not on file  . Highest education level: Not on file  Occupational History  . Not on file  Social Needs  . Financial resource strain: Not on file  . Food insecurity    Worry: Not on file    Inability: Not on file  . Transportation needs    Medical: Not on file    Non-medical: Not on file  Tobacco Use  . Smoking status: Never Smoker  . Smokeless tobacco: Never Used  Substance and Sexual Activity  . Alcohol use: Never    Frequency: Never  . Drug use: Never  . Sexual activity: Not on file  Lifestyle  . Physical activity    Days per week: Not on file    Minutes per session: Not on file  . Stress: Not on file  Relationships  . Social Herbalist on  phone: Not on file    Gets together: Not on file    Attends religious service: Not on file    Active member of club or organization: Not on file    Attends meetings of clubs or organizations: Not on file    Relationship status: Not on file  . Intimate partner violence    Fear of current or ex partner: Not on file    Emotionally abused: Not on file    Physically abused: Not on file    Forced sexual activity: Not on file  Other Topics Concern  . Not on file  Social History Narrative  . Not on file    Outpatient Medications Prior to Visit  Medication Sig Dispense Refill  . Blood Pressure KIT 1 kit by Does not apply route 2 (two) times daily. 1 kit 0   No facility-administered medications prior to visit.     No Known Allergies  ROS Review of Systems  Constitutional: Negative.   Respiratory: Negative.   Cardiovascular: Negative.   Musculoskeletal: Negative.   Neurological: Negative.   Psychiatric/Behavioral: Negative.       Objective:    Physical Exam  Constitutional: She is oriented to person, place, and time. She appears well-developed and well-nourished.  HENT:  Head:  Normocephalic and atraumatic.  Cardiovascular: Normal rate and regular rhythm.  Pulmonary/Chest: Effort normal and breath sounds normal.  Abdominal: Soft. Bowel sounds are normal.  Neurological: She is alert and oriented to person, place, and time.  Skin: Skin is warm and dry.  Psychiatric: She has a normal mood and affect. Her behavior is normal. Judgment and thought content normal.    BP 125/77 (BP Location: Left Arm, Patient Position: Sitting)   Pulse 82   Ht 4' 11.06" (1.5 m)   Wt 177 lb 3.2 oz (80.4 kg)   SpO2 99%   BMI 35.72 kg/m  Wt Readings from Last 3 Encounters:  10/02/18 177 lb 3.2 oz (80.4 kg)  08/14/18 172 lb (78 kg)  03/22/18 174 lb 4.8 oz (79.1 kg)   She was encouraged to continue on weight loss regimen.  Health Maintenance Due  Topic Date Due  . HIV Screening  03/12/1988   . TETANUS/TDAP  03/12/1992  . INFLUENZA VACCINE  09/15/2018    There are no preventive care reminders to display for this patient.  Lab Results  Component Value Date   TSH 1.620 12/06/2017   Lab Results  Component Value Date   WBC 7.3 04/18/2018   HGB 13.3 04/18/2018   HCT 38.7 04/18/2018   MCV 85 04/18/2018   PLT 258 04/18/2018   Lab Results  Component Value Date   NA 140 04/18/2018   K 3.7 04/18/2018   CO2 24 04/18/2018   GLUCOSE 110 (H) 04/18/2018   BUN 8 04/18/2018   CREATININE 0.70 04/18/2018   BILITOT 0.5 04/18/2018   ALKPHOS 71 04/18/2018   AST 16 04/18/2018   ALT 15 04/18/2018   PROT 6.8 04/18/2018   ALBUMIN 3.9 04/18/2018   CALCIUM 9.7 04/18/2018   Lab Results  Component Value Date   CHOL 196 08/08/2018   Lab Results  Component Value Date   HDL 38 (L) 08/08/2018   Lab Results  Component Value Date   LDLCALC 101 (H) 08/08/2018   Lab Results  Component Value Date   TRIG 285 (H) 08/08/2018   Lab Results  Component Value Date   CHOLHDL 5.2 (H) 08/08/2018   Lab Results  Component Value Date   HGBA1C 5.5 04/18/2018      Assessment & Plan:     1. Breast complaint -On palpation, it showed fibroglandular pattern to both breast and also at 12 o'clock on her left breast. No nipple discharge or lymphadenopathy. She was encouraged to complete charity care application for Ob/Gyn referral due to report from her 03/21/18 mammogram report that states that "the breast tissue is heterogeneously dense, which may obscure small masses". She was advised to notify clinic for worsening symptoms. - Ambulatory referral to Obstetrics / Gynecology   Follow-up: Return in about 2 months (around 12/02/2018), or if symptoms worsen or fail to improve.    Farhana Fellows Jerold Coombe, NP

## 2018-12-13 ENCOUNTER — Ambulatory Visit: Payer: Self-pay | Admitting: Ophthalmology

## 2019-01-02 ENCOUNTER — Encounter: Payer: Self-pay | Admitting: Obstetrics and Gynecology

## 2019-01-02 ENCOUNTER — Ambulatory Visit (INDEPENDENT_AMBULATORY_CARE_PROVIDER_SITE_OTHER): Payer: Self-pay | Admitting: Obstetrics and Gynecology

## 2019-01-02 ENCOUNTER — Other Ambulatory Visit: Payer: Self-pay

## 2019-01-02 VITALS — BP 141/82 | HR 88 | Ht 62.99 in | Wt 184.6 lb

## 2019-01-02 DIAGNOSIS — N6012 Diffuse cystic mastopathy of left breast: Secondary | ICD-10-CM

## 2019-01-02 NOTE — Progress Notes (Signed)
Patient comes in today for new patient appointment. She has a lump in the left breast. Denies discharge from nipple.

## 2019-01-02 NOTE — Progress Notes (Signed)
HPI:      [name] is a 43 y.o. No obstetric history on file. who LMP was Patient's last menstrual period was 12/18/2018.  Subjective:   She presents today with complaint of a 3 to 34-monthhistory of left breast mass.  She states she has pain in the breast and a small area that hurts each month.  She states it is worse with her menstrual cycle and then resolves after her cycle ends.  She denies nipple discharge.  She denies any family history of breast cancer. She does drink a large amount of coffee and eats chocolate regularly. Patient had a normal mammogram in January.    Hx: The following portions of the patient's history were reviewed and updated as appropriate:             She  has a past medical history of Hypertension. She does not have any pertinent problems on file. She  has no past surgical history on file. Her family history is not on file. She  reports that she has never smoked. She has never used smokeless tobacco. She reports that she does not drink alcohol or use drugs. She has a current medication list which includes the following prescription(s): blood pressure. She has No Known Allergies.       Review of Systems:  Review of Systems  Constitutional: Denied constitutional symptoms, night sweats, recent illness, fatigue, fever, insomnia and weight loss.  Eyes: Denied eye symptoms, eye pain, photophobia, vision change and visual disturbance.  Ears/Nose/Throat/Neck: Denied ear, nose, throat or neck symptoms, hearing loss, nasal discharge, sinus congestion and sore throat.  Cardiovascular: Denied cardiovascular symptoms, arrhythmia, chest pain/pressure, edema, exercise intolerance, orthopnea and palpitations.  Respiratory: Denied pulmonary symptoms, asthma, pleuritic pain, productive sputum, cough, dyspnea and wheezing.  Gastrointestinal: Denied, gastro-esophageal reflux, melena, nausea and vomiting.  Genitourinary: See HPI for additional information.   Musculoskeletal: Denied musculoskeletal symptoms, stiffness, swelling, muscle weakness and myalgia.  Dermatologic: Denied dermatology symptoms, rash and scar.  Neurologic: Denied neurology symptoms, dizziness, headache, neck pain and syncope.  Psychiatric: Denied psychiatric symptoms, anxiety and depression.  Endocrine: Denied endocrine symptoms including hot flashes and night sweats.   Meds:   Current Outpatient Medications on File Prior to Visit  Medication Sig Dispense Refill  . Blood Pressure KIT 1 kit by Does not apply route 2 (two) times daily. 1 kit 0   No current facility-administered medications on file prior to visit.     Objective:     Vitals:   01/02/19 1556  BP: (!) 141/82  Pulse: 88              Bilateral breast examination reveals no dominant masses.  No nipple discharge.  No axillary adenopathy.  When the patient points to the area in question possibly a small mobile elongated structure palpated although this is not definitive.  It seems as if the patient can find it because she can feel it in her breast not with her fingertip.  Assessment:    No obstetric history on file. Patient Active Problem List   Diagnosis Date Noted  . Essential hypertension 08/14/2018  . Breast complaint 08/14/2018  . Elevated lipids 08/14/2018  . Hematuria 08/14/2018     1. Fibrocystic change of breast, left     Very likely fibrocystic breast change.  No evidence of cancer based on exam or previous mammogram.  Changes are cyclic with menses.  Caffeine and chocolate a significant part of patient's daily routine.  Plan:            1.  Slowly wean from caffeine and chocolate for a month and see if her pain resolves.  If pain not improved consider mammography.  Patient to contact us. Orders No orders of the defined types were placed in this encounter.   No orders of the defined types were placed in this encounter.     F/U  Return for Pt to contact us if symptoms worsen. I  spent 22 minutes involved in the care of this patient of which greater than 50% was spent discussing effect of caffeine and chocolate on breast cysts, fibrocystic breast changes, differentiation from cancer, mammography.  Future work-up if necessary.  All questions answered.  Finis Bud, M.D. 01/02/2019 4:42 PM

## 2019-02-01 ENCOUNTER — Telehealth: Payer: Self-pay

## 2019-02-01 NOTE — Telephone Encounter (Signed)
Pt called stating lump feels a little smaller & breast feels heavy & painful. Please return call to pt [phone].

## 2019-02-05 NOTE — Telephone Encounter (Signed)
LM to return call.

## 2019-03-04 ENCOUNTER — Telehealth: Payer: Self-pay

## 2019-03-04 NOTE — Progress Notes (Signed)
Triaged patient for [facility] appointment 03/05/2019.  [employer] interpreters used to translate.  Patient complains of left breast mass and pain, so exam will be performed prior to mammogram.

## 2019-03-04 NOTE — Telephone Encounter (Signed)
LM to return call. Patient has not returned call.

## 2019-03-05 ENCOUNTER — Encounter: Payer: Self-pay | Admitting: *Deleted

## 2019-03-05 ENCOUNTER — Other Ambulatory Visit: Payer: Self-pay

## 2019-03-05 ENCOUNTER — Ambulatory Visit: Payer: Self-pay | Attending: Oncology | Admitting: *Deleted

## 2019-03-05 VITALS — BP 131/91 | HR 78 | Temp 97.7°F | Ht 62.0 in | Wt 184.0 lb

## 2019-03-05 DIAGNOSIS — N63 Unspecified lump in unspecified breast: Secondary | ICD-10-CM

## 2019-03-05 NOTE — Progress Notes (Signed)
  Subjective:     Patient ID: Nicole Lambert, female   DOB: 06/06/1975, 44 y.o.   MRN: [id number]  HPI   Review of Systems     Objective:   Physical Exam Chest:     Breasts: Breasts are asymmetrical.        Right: Mass present. No swelling, bleeding, inverted nipple, nipple discharge, skin change or tenderness.        Left: Mass and tenderness present. No swelling, bleeding, inverted nipple, nipple discharge or skin change.       Comments: Tenderness to palpation at the left upper outer quadrant Lymphadenopathy:     Upper Body:     Right upper body: No supraclavicular or axillary adenopathy.     Left upper body: No supraclavicular or axillary adenopathy.        Assessment:     44 year old Arabic speaking female returns to [facility] for annual screening.  [place] interpreter [phone] used for interpretation during the interview.  Patient complains of feeling a "rock" and left breast pain for about 8 months.  States her doctor saw her and said everything was normal.  On clinical breast exam the left breast is larger than the right breast.  I can palpate an approximate 1 cm mobile, smooth nodule at 11:00 right breast 3 cm from the nipple, an approximate 2 cm mobile smooth nodule at 4-5:00 left breast.  She is very tender to palpation at the upper outer quadrant of the left breast.  I asked the patient to show me the area where the "rock" is at.  She sat up in semi-fowlers position and pointed to 12:00 left breast.  I can palpate an approximate 3 cm thickening at 12:00 4 cm from the nipple.  Patient states the left breast has always been bigger than the right, but in the last few months it feels "heavier and larger".  Taught self breast awareness.  Last pap on 03/21/18 was negative / negative.  Next pap due in 2025. Patient has been screened for eligibility.  She does not have any insurance, Medicare or Medicaid.  She also meets financial eligibility.    See [facility] model breast cancer risk assessment  below; Risk Assessment    Risk Scores      03/05/2019 03/21/2018   Last edited by: Theodore Demark, RN [place], Laddie Aquas, CMA   5-year risk: 1 % 1 %   Lifetime risk: 11.6 % 11.6 %             Plan:     Will get bilateral diagnostic mammogram and ultrasound.  Will schedule that appointment and call patient back.  She is agreeable.  [place] is going out of [place] in [place].  Will follow up per [facility] protocol.

## 2019-03-05 NOTE — Progress Notes (Signed)
Left patient a message via [place] to call me back that I have her appointment for her mammogram on 03/11/19 @ 10:40.

## 2019-03-06 ENCOUNTER — Ambulatory Visit: Payer: Self-pay | Admitting: Gerontology

## 2019-03-06 ENCOUNTER — Encounter: Payer: Self-pay | Admitting: Gerontology

## 2019-03-06 VITALS — BP 122/82 | HR 83 | Ht 62.99 in | Wt 183.0 lb

## 2019-03-06 DIAGNOSIS — Z0000 Encounter for general adult medical examination without abnormal findings: Secondary | ICD-10-CM | POA: Insufficient documentation

## 2019-03-06 DIAGNOSIS — E785 Hyperlipidemia, unspecified: Secondary | ICD-10-CM

## 2019-03-06 DIAGNOSIS — N6459 Other signs and symptoms in breast: Secondary | ICD-10-CM

## 2019-03-06 DIAGNOSIS — R319 Hematuria, unspecified: Secondary | ICD-10-CM

## 2019-03-06 NOTE — Progress Notes (Signed)
Established Patient Office Visit  Subjective:  Patient ID: Nicole Lambert, female    DOB: 24-Feb-1975  Age: 44 y.o. MRN: [id number]  CC: No chief complaint on file.   HPI Nicole Lambert presents for follow up of breast pain. An Arabic interpreter ID # [phone] was utilized during visit. She was seen by Gynecologist Dr Kathleen Lime on 01/02/2019 for left breast mass. He recommended she wean from caffeine and chocolate, and she reports cutting down on caffeine and chocolate. She was also seen by Ms. Mardene Speak RN on 03/04/2018 for breast lump and she's scheduled for Mammogram and Ultrasound on 03/11/2019. Currently, she states that her left breast is bigger, hard and painful to touch, and her period will start towards the end of the month. She denies nipple discharge, fever and chills. She requests Influenza vaccine, Overall she states that she's doing well and offers no further complaint.  Past Medical History:  Diagnosis Date  . Hypertension     No past surgical history on file.  No family history on file.  Social History   Socioeconomic History  . Marital status: Married    Spouse name: Not on file  . Number of children: Not on file  . Years of education: Not on file  . Highest education level: Not on file  Occupational History  . Not on file  Tobacco Use  . Smoking status: Never Smoker  . Smokeless tobacco: Never Used  Substance and Sexual Activity  . Alcohol use: Never  . Drug use: Never  . Sexual activity: Not on file  Other Topics Concern  . Not on file  Social History Narrative  . Not on file   Social Determinants of Health   Financial Resource Strain:   . Difficulty of Paying Living Expenses: Not on file  Food Insecurity:   . Worried About Charity fundraiser in the Last Year: Not on file  . Ran Out of Food in the Last Year: Not on file  Transportation Needs:   . Lack of Transportation (Medical): Not on file  . Lack of Transportation (Non-Medical): Not on file   Physical Activity:   . Days of Exercise per Week: Not on file  . Minutes of Exercise per Session: Not on file  Stress:   . Feeling of Stress : Not on file  Social Connections:   . Frequency of Communication with Friends and Family: Not on file  . Frequency of Social Gatherings with Friends and Family: Not on file  . Attends Religious Services: Not on file  . Active Member of Clubs or Organizations: Not on file  . Attends Archivist Meetings: Not on file  . Marital Status: Not on file  Intimate Partner Violence:   . Fear of Current or Ex-Partner: Not on file  . Emotionally Abused: Not on file  . Physically Abused: Not on file  . Sexually Abused: Not on file    Outpatient Medications Prior to Visit  Medication Sig Dispense Refill  . Blood Pressure KIT 1 kit by Does not apply route 2 (two) times daily. 1 kit 0   No facility-administered medications prior to visit.    No Known Allergies  ROS Review of Systems  Constitutional: Negative.   Respiratory: Negative.   Cardiovascular: Negative.   Genitourinary: Negative.   Skin: Negative.   Neurological: Negative.   Psychiatric/Behavioral: Negative.       Objective:    Physical Exam  Constitutional: She is oriented to person, place,  and time. She appears well-developed.  HENT:  Head: Normocephalic and atraumatic.  Eyes: Pupils are equal, round, and reactive to light. EOM are normal.  Cardiovascular: Normal rate and regular rhythm.  Pulmonary/Chest: Effort normal and breath sounds normal.  Abdominal: Soft. Bowel sounds are normal.  Neurological: She is alert and oriented to person, place, and time.  Skin: Skin is warm and dry.  Psychiatric: She has a normal mood and affect. Her behavior is normal. Judgment and thought content normal.    BP 122/82 (BP Location: Left Arm, Patient Position: Sitting)   Pulse 83   Ht 5' 2.99" (1.6 m)   Wt 182 lb 15.7 oz (83 kg)   SpO2 100%   BMI 32.42 kg/m  Wt Readings from  Last 3 Encounters:  03/06/19 182 lb 15.7 oz (83 kg)  03/05/19 184 lb (83.5 kg)  01/02/19 184 lb 9.6 oz (83.7 kg)  She lost 2 pounds and was advised to continue on her weight loss regimen.   Health Maintenance Due  Topic Date Due  . HIV Screening  03/12/1988  . TETANUS/TDAP  03/12/1992  . INFLUENZA VACCINE  09/15/2018    There are no preventive care reminders to display for this patient.  Lab Results  Component Value Date   TSH 1.620 12/06/2017   Lab Results  Component Value Date   WBC 7.3 04/18/2018   HGB 13.3 04/18/2018   HCT 38.7 04/18/2018   MCV 85 04/18/2018   PLT 258 04/18/2018   Lab Results  Component Value Date   NA 140 04/18/2018   K 3.7 04/18/2018   CO2 24 04/18/2018   GLUCOSE 110 (H) 04/18/2018   BUN 8 04/18/2018   CREATININE 0.70 04/18/2018   BILITOT 0.5 04/18/2018   ALKPHOS 71 04/18/2018   AST 16 04/18/2018   ALT 15 04/18/2018   PROT 6.8 04/18/2018   ALBUMIN 3.9 04/18/2018   CALCIUM 9.7 04/18/2018   Lab Results  Component Value Date   CHOL 196 08/08/2018   Lab Results  Component Value Date   HDL 38 (L) 08/08/2018   Lab Results  Component Value Date   LDLCALC 101 (H) 08/08/2018   Lab Results  Component Value Date   TRIG 285 (H) 08/08/2018   Lab Results  Component Value Date   CHOLHDL 5.2 (H) 08/08/2018   Lab Results  Component Value Date   HGBA1C 5.5 04/18/2018      Assessment & Plan:   1. Hematuria, unspecified type - She denies hematuria, and was lost to Urology follow up due to [place] 19 Pandemic, will recheck urine sample and refer accordingly. - Urinalysis; Future  2. Elevated lipids - She was advised to continue on low fat/ cholesterol diet and exercise as tolerated. - Will recheck Lipid panel; Future  3. Breast complaint - She was advised to keep her Mammogram and breast Ultrasound appointment on 03/11/2019 and to notify clinic for worsening symptoms.  4. Healthcare maintenance  - Flu Vaccine QUAD 6+ mos PF IM  (Fluarix Quad PF) was administered.     Follow-up: Return in about 11 weeks (around 05/22/2019), or if symptoms worsen or fail to improve.    Nicole Lambert Jerold Coombe, NP

## 2019-03-07 ENCOUNTER — Encounter: Payer: Self-pay | Admitting: *Deleted

## 2019-03-07 NOTE — Progress Notes (Signed)
Talked to patient today to inform her of her mammogram on Monday 03/11/2019 @ 10:30.  Patient was able to repeat appointment date and time.

## 2019-03-11 ENCOUNTER — Ambulatory Visit
Admission: RE | Admit: 2019-03-11 | Discharge: 2019-03-11 | Disposition: A | Discharge status: Home / Self Care | Payer: Self-pay | Source: Ambulatory Visit | Attending: Oncology | Admitting: Oncology

## 2019-03-11 DIAGNOSIS — N63 Unspecified lump in unspecified breast: Secondary | ICD-10-CM

## 2019-03-12 ENCOUNTER — Other Ambulatory Visit: Payer: Self-pay | Admitting: *Deleted

## 2019-03-12 ENCOUNTER — Other Ambulatory Visit: Payer: Self-pay | Admitting: Internal Medicine

## 2019-03-12 DIAGNOSIS — N63 Unspecified lump in unspecified breast: Secondary | ICD-10-CM

## 2019-03-14 ENCOUNTER — Ambulatory Visit
Admission: RE | Admit: 2019-03-14 | Discharge: 2019-03-14 | Disposition: A | Discharge status: Home / Self Care | Payer: Self-pay | Source: Ambulatory Visit | Attending: Oncology | Admitting: Oncology

## 2019-03-14 DIAGNOSIS — N63 Unspecified lump in unspecified breast: Secondary | ICD-10-CM

## 2019-03-14 HISTORY — PX: BREAST BIOPSY: SHX20

## 2019-03-15 LAB — SURGICAL PATHOLOGY

## 2019-03-20 ENCOUNTER — Other Ambulatory Visit: Payer: Self-pay | Admitting: *Deleted

## 2019-03-20 DIAGNOSIS — N63 Unspecified lump in unspecified breast: Secondary | ICD-10-CM

## 2019-04-09 ENCOUNTER — Encounter: Payer: Self-pay | Admitting: *Deleted

## 2019-04-09 NOTE — Progress Notes (Signed)
Letter mailed to inform patient of her next mammogram on 09/02/19 @ 1:40.

## 2019-05-08 ENCOUNTER — Other Ambulatory Visit: Payer: Self-pay

## 2019-05-22 ENCOUNTER — Ambulatory Visit: Payer: Self-pay | Admitting: Gerontology

## 2019-05-28 ENCOUNTER — Ambulatory Visit: Payer: Self-pay | Admitting: Gerontology

## 2019-06-06 ENCOUNTER — Ambulatory Visit: Payer: Self-pay | Admitting: Urology

## 2019-06-06 ENCOUNTER — Other Ambulatory Visit: Payer: Self-pay

## 2019-06-06 VITALS — BP 113/79 | HR 70 | Temp 97.6°F | Ht 62.99 in | Wt 175.2 lb

## 2019-06-06 DIAGNOSIS — U071 COVID-19: Secondary | ICD-10-CM

## 2019-06-06 NOTE — Progress Notes (Signed)
  Patient: Nicole Lambert Female    DOB: 12-26-1975   44 y.o.   MRN: [id number] Visit Date: 06/06/2019  Today's Provider: [place]   Chief Complaint  Patient presents with  . Follow-up    No new complaints  . Fatigue    Had COVID when she traveled to her home country, was diagnosed March 13th. Wnts to know if there is any help or when it will go away. Sore throat, burning ears, severe nausea, fatigue, chills, cough during the two weeks. Also had severe back pain during covid. What has lasted with her is the severe fatigue even with the smallest tasks-- gets palpitations and lightheaded even with talking too much. Received COVID vaccine 05/25/19   Subjective:    HPI  [place] hospital [place]  Was given anti-virals (Favipiravir) and omeprazole and maybe some oxygen/nebulizer.  Was not hospitalized.       No Known Allergies Previous Medications   BLOOD PRESSURE KIT    1 kit by Does not apply route 2 (two) times daily.   OMEPRAZOLE (PRILOSEC) 20 MG CAPSULE    Take 20 mg by mouth daily.    Review of Systems  Social History   Tobacco Use  . Smoking status: Never Smoker  . Smokeless tobacco: Never Used  Substance Use Topics  . Alcohol use: Never   Objective:   BP 113/79   Pulse 70   Temp 97.6 F (36.4 C)   Ht 5' 2.99" (1.6 m)   Wt 175 lb 3.2 oz (79.5 kg)   SpO2 98%   BMI 31.04 kg/m   Physical Exam      Assessment & Plan:      1. COVID-19 - DG Chest 2 View; Future - CBC with Differential/Platelet; Future - Comprehensive metabolic panel; Future - Hemoglobin A1c; Future - Lipid panel; Future - TSH; Future   [facility] [place]

## 2019-06-10 ENCOUNTER — Ambulatory Visit
Admission: RE | Admit: 2019-06-10 | Discharge: 2019-06-10 | Disposition: A | Discharge status: Home / Self Care | Payer: HRSA Program | Source: Ambulatory Visit | Attending: Urology | Admitting: Urology

## 2019-06-10 DIAGNOSIS — U071 COVID-19: Secondary | ICD-10-CM | POA: Insufficient documentation

## 2019-06-12 ENCOUNTER — Other Ambulatory Visit: Payer: Self-pay

## 2019-06-12 DIAGNOSIS — U071 COVID-19: Secondary | ICD-10-CM

## 2019-06-12 DIAGNOSIS — R319 Hematuria, unspecified: Secondary | ICD-10-CM

## 2019-06-12 DIAGNOSIS — E785 Hyperlipidemia, unspecified: Secondary | ICD-10-CM

## 2019-06-13 LAB — LIPID PANEL
Chol/HDL Ratio: 5.7 ratio — ABNORMAL HIGH (ref 0.0–4.4)
Cholesterol, Total: 182 mg/dL (ref 100–199)
HDL: 32 mg/dL — ABNORMAL LOW (ref >39)
LDL Chol Calc (NIH): 97 mg/dL (ref 0–99)
Triglycerides: 316 mg/dL — ABNORMAL HIGH (ref 0–149)
VLDL Cholesterol Cal: 53 mg/dL — ABNORMAL HIGH (ref 5–40)

## 2019-06-13 LAB — COMPREHENSIVE METABOLIC PANEL
ALT: 13 IU/L (ref 0–32)
AST: 13 IU/L (ref 0–40)
Albumin/Globulin Ratio: 1.4 (ref 1.2–2.2)
Albumin: 3.9 g/dL (ref 3.8–4.8)
Alkaline Phosphatase: 68 IU/L (ref 39–117)
BUN/Creatinine Ratio: 16 (ref 9–23)
BUN: 11 mg/dL (ref 6–24)
Bilirubin Total: 0.3 mg/dL (ref 0.0–1.2)
CO2: 23 mmol/L (ref 20–29)
Calcium: 9.4 mg/dL (ref 8.7–10.2)
Chloride: 106 mmol/L (ref 96–106)
Creatinine, Ser: 0.69 mg/dL (ref 0.57–1.00)
GFR calc Af Amer: 121 mL/min/{1.73_m2} (ref >59)
GFR calc non Af Amer: 105 mL/min/{1.73_m2} (ref >59)
Globulin, Total: 2.7 g/dL (ref 1.5–4.5)
Glucose: 99 mg/dL (ref 65–99)
Potassium: 4.1 mmol/L (ref 3.5–5.2)
Sodium: 141 mmol/L (ref 134–144)
Total Protein: 6.6 g/dL (ref 6.0–8.5)

## 2019-06-13 LAB — CBC WITH DIFFERENTIAL/PLATELET
Basophils Absolute: 0 10*3/uL (ref 0.0–0.2)
Basos: 1 %
EOS (ABSOLUTE): 0.1 10*3/uL (ref 0.0–0.4)
Eos: 2 %
Hematocrit: 37 % (ref 34.0–46.6)
Hemoglobin: 12.3 g/dL (ref 11.1–15.9)
Immature Grans (Abs): 0 10*3/uL (ref 0.0–0.1)
Immature Granulocytes: 0 %
Lymphocytes Absolute: 2.8 10*3/uL (ref 0.7–3.1)
Lymphs: 46 %
MCH: 29.2 pg (ref 26.6–33.0)
MCHC: 33.2 g/dL (ref 31.5–35.7)
MCV: 88 fL (ref 79–97)
Monocytes Absolute: 0.5 10*3/uL (ref 0.1–0.9)
Monocytes: 8 %
Neutrophils Absolute: 2.6 10*3/uL (ref 1.4–7.0)
Neutrophils: 43 %
Platelets: 218 10*3/uL (ref 150–450)
RBC: 4.21 x10E6/uL (ref 3.77–5.28)
RDW: 13.7 % (ref 11.7–15.4)
WBC: 6 10*3/uL (ref 3.4–10.8)

## 2019-06-13 LAB — URINALYSIS
Bilirubin, UA: NEGATIVE
Glucose, UA: NEGATIVE
Ketones, UA: NEGATIVE
Nitrite, UA: NEGATIVE
Protein,UA: NEGATIVE
Specific Gravity, UA: 1.021 (ref 1.005–1.030)
Urobilinogen, Ur: 0.2 mg/dL (ref 0.2–1.0)
pH, UA: 5 (ref 5.0–7.5)

## 2019-06-13 LAB — TSH: TSH: 3.31 u[IU]/mL (ref 0.450–4.500)

## 2019-06-13 LAB — HEMOGLOBIN A1C
Est. average glucose Bld gHb Est-mCnc: 105 mg/dL
Hgb A1c MFr Bld: 5.3 % (ref 4.8–5.6)

## 2019-06-20 ENCOUNTER — Other Ambulatory Visit: Payer: Self-pay

## 2019-06-20 ENCOUNTER — Ambulatory Visit: Payer: Self-pay | Admitting: Urology

## 2019-06-20 VITALS — BP 135/88 | HR 63 | Temp 98.8°F | Wt 178.4 lb

## 2019-06-20 DIAGNOSIS — U071 COVID-19: Secondary | ICD-10-CM

## 2019-06-20 DIAGNOSIS — R3129 Other microscopic hematuria: Secondary | ICD-10-CM

## 2019-06-20 DIAGNOSIS — R8290 Unspecified abnormal findings in urine: Secondary | ICD-10-CM

## 2019-06-20 NOTE — Progress Notes (Signed)
  Patient: Nicole Lambert Female    DOB: 1975/11/09   44 y.o.   MRN: [id number] Visit Date: 06/20/2019  Today's Provider: [place]   Chief Complaint  Patient presents with  . Follow-up    Review of imaging- if lungs not clear then next steps    Subjective:    HPI  Chest X-ray negative Blood work is normal  She states that her previous symptoms of severe fatigue and palpitations have resolved.  She feels that fasting and observing her religious holiday have made the difference.  She has positive UA dips for blood and will need a microscopic analysis to confirm acute microscopic hematuria.   No Known Allergies Previous Medications   BLOOD PRESSURE KIT    1 kit by Does not apply route 2 (two) times daily.   OMEPRAZOLE (PRILOSEC) 20 MG CAPSULE    Take 20 mg by mouth daily.    Review of Systems  Social History   Tobacco Use  . Smoking status: Never Smoker  . Smokeless tobacco: Never Used  Substance Use Topics  . Alcohol use: Never   Objective:   BP 135/88   Pulse 63   Temp 98.8 F (37.1 C)   Wt 178 lb 6.4 oz (80.9 kg)   SpO2 100%   BMI 29.69 kg/m   Physical Exam      Assessment & Plan:    1. Abnormal urinalysis - Urinalysis, Complete -Will need microscopic analysis of urine to confirm acute microscopic hematuria which is defined as 3 RBCs or greater -If her urine does demonstrate acute microscopic hematuria, she will need referral to urology for hematuria work-up  2. COVID-19 - recovered    [facility] [place]

## 2019-07-03 ENCOUNTER — Other Ambulatory Visit: Payer: Self-pay

## 2019-07-03 VITALS — BP 115/77 | HR 87 | Temp 97.3°F | Ht 62.75 in | Wt 176.9 lb

## 2019-07-03 DIAGNOSIS — R8290 Unspecified abnormal findings in urine: Secondary | ICD-10-CM

## 2019-07-03 NOTE — Progress Notes (Signed)
lipid

## 2019-07-04 LAB — URINALYSIS, ROUTINE W REFLEX MICROSCOPIC
Bilirubin, UA: NEGATIVE
Glucose, UA: NEGATIVE
Ketones, UA: NEGATIVE
Nitrite, UA: NEGATIVE
Protein,UA: NEGATIVE
Specific Gravity, UA: 1.019 (ref 1.005–1.030)
Urobilinogen, Ur: 0.2 mg/dL (ref 0.2–1.0)
pH, UA: 5.5 (ref 5.0–7.5)

## 2019-07-04 LAB — LIPID PANEL
Chol/HDL Ratio: 6.1 ratio — ABNORMAL HIGH (ref 0.0–4.4)
Cholesterol, Total: 221 mg/dL — ABNORMAL HIGH (ref 100–199)
HDL: 36 mg/dL — ABNORMAL LOW (ref >39)
LDL Chol Calc (NIH): 125 mg/dL — ABNORMAL HIGH (ref 0–99)
Triglycerides: 336 mg/dL — ABNORMAL HIGH (ref 0–149)
VLDL Cholesterol Cal: 60 mg/dL — ABNORMAL HIGH (ref 5–40)

## 2019-07-04 LAB — MICROSCOPIC EXAMINATION
Casts: NONE SEEN /lpf
Epithelial Cells (non renal): >10 /hpf — AB (ref 0–10)

## 2019-07-18 ENCOUNTER — Ambulatory Visit: Payer: Self-pay | Admitting: Urology

## 2019-07-18 ENCOUNTER — Other Ambulatory Visit: Payer: Self-pay

## 2019-07-18 VITALS — BP 129/91 | HR 81 | Temp 98.1°F | Ht 62.0 in | Wt 178.9 lb

## 2019-07-18 DIAGNOSIS — R3129 Other microscopic hematuria: Secondary | ICD-10-CM

## 2019-07-18 DIAGNOSIS — N83201 Unspecified ovarian cyst, right side: Secondary | ICD-10-CM

## 2019-07-18 DIAGNOSIS — D3502 Benign neoplasm of left adrenal gland: Secondary | ICD-10-CM

## 2019-07-18 NOTE — Progress Notes (Signed)
  Patient: Nicole Lambert Female    DOB: 10-18-1975   44 y.o.   MRN: [id number] Visit Date: 07/18/2019  Today's Provider: [place]   Chief Complaint  Patient presents with  . Follow-up    says everything is good, sometimes feels tired but feels this is normal with the pandemic   Subjective:    HPI Nicole Lambert is a 44 y.o. female with HTN and incidental finding of microscopic hematuria on exam using interpretive services through the language line  Patient was found to have microscopic hematuria on 07/03/2019 with 11-30 RBC's/hpf.  Patient does have a prior history of microscopic hematuria.    She had a tri-phasic CT in 03/2018 2.0 cm left adrenal adenoma. Fluid density 4.2 cm cystic lesion in the right ovary, probably benign cyst category, follow up pelvic ultrasound is recommended in 6-12 weeks.   Nonsmoker.  No family history of bladder or kidney cancer.   No Known Allergies Previous Medications   BLOOD PRESSURE KIT    1 kit by Does not apply route 2 (two) times daily.   OMEPRAZOLE (PRILOSEC) 20 MG CAPSULE    Take 20 mg by mouth daily.    Review of Systems  Social History   Tobacco Use  . Smoking status: Never Smoker  . Smokeless tobacco: Never Used  Substance Use Topics  . Alcohol use: Never   Objective:   BP (!) 129/91   Pulse 81   Temp 98.1 F (36.7 C)   Ht 5' 2" (1.575 m)   Wt 178 lb 14.4 oz (81.1 kg)   LMP 07/08/2019   SpO2 97%   BMI 32.72 kg/m   Physical Exam Constitutional:  Well nourished. Alert and oriented, No acute distress. HEENT: [place] AT, moist mucus membranes.  Trachea midline, no masses. Cardiovascular: No clubbing, cyanosis, or edema. Respiratory: Normal respiratory effort, no increased work of breathing. Neurologic: Grossly intact, no focal deficits, moving all 4 extremities. Psychiatric: Normal mood and affect.    Assessment & Plan:    1. Adrenal adenoma, left 2.0 by 1.3 cm left adrenal adenoma - Ambulatory referral to  Endocrinology  2. Right ovarian cyst Fluid density 4.2 cm cystic lesion in the right ovary, probably benign cyst category, follow up pelvic ultrasound is recommended in 6-12 weeks. - Ambulatory referral to Obstetrics / Gynecology  3. Microscopic hematuria - 11-30 RBC's on UA on 07/03/2019  - Ambulatory referral to Urology   [facility] [place]

## 2019-07-24 ENCOUNTER — Telehealth: Payer: Self-pay | Admitting: Pharmacy Technician

## 2019-07-24 NOTE — Telephone Encounter (Signed)
Received updated proof of income.  Patient eligible to receive medication assistance at [facility] until time for re-certification in 9359, and as long as eligibility requirements continue to be met.  [facility]

## 2019-08-01 ENCOUNTER — Ambulatory Visit (INDEPENDENT_AMBULATORY_CARE_PROVIDER_SITE_OTHER): Payer: Self-pay | Admitting: Urology

## 2019-08-01 ENCOUNTER — Other Ambulatory Visit: Payer: Self-pay

## 2019-08-01 ENCOUNTER — Encounter: Payer: Self-pay | Admitting: Urology

## 2019-08-01 VITALS — BP 104/68 | HR 84 | Ht 62.0 in | Wt 176.0 lb

## 2019-08-01 DIAGNOSIS — R3129 Other microscopic hematuria: Secondary | ICD-10-CM

## 2019-08-01 DIAGNOSIS — N83201 Unspecified ovarian cyst, right side: Secondary | ICD-10-CM

## 2019-08-01 DIAGNOSIS — D3502 Benign neoplasm of left adrenal gland: Secondary | ICD-10-CM

## 2019-08-01 MED ORDER — FLUCONAZOLE 150 MG PO TABS: 150.0000 mg | ORAL_TABLET | Freq: Once | ORAL | 0 refills | Status: AC

## 2019-08-01 NOTE — Progress Notes (Signed)
08/01/2019 8:47 AM   Nicole Lambert 1975-07-19 [id number]  Referring provider: Nori Riis, PA-C [address] [place],  [place] [id number]  Chief Complaint  Patient presents with  . Hematuria    New Patient    HPI: Nicole Lambert is a 44 y.o. female with findings of microscopic hematuria who presents today for further evaluation with interpreter, Salma.  Patient was found to have microscopic hematuria on 07/03/2019 with 11-30 RBC's/hpf.  She does not have a prior history of recurrent urinary tract infections, nephrolithiasis, trauma to the genitourinary tract or malignancies of the genitourinary tract.   She does not have a family medical history of nephrolithiasis, malignancies of the genitourinary tract or hematuria.   She is not having symptoms of frequent urination, urgency, dysuria, nocturia, incontinence, hesitancy, intermittency, straining to urinate or a weak urinary stream.  Her UA today demonstrates 3-10 RBC's.  Patient denies any gross hematuria, dysuria or suprapubic/flank pain.  Patient denies any fevers, chills, nausea or vomiting.    She had a CT urogram in 03/2018 2.0 by 1.3 cm left adrenal adenoma, precontrast density 6 Hounsfield units.  0.5 by 0.4 cm hypodense lesion of the left kidney upper pole on image 19/13 is technically too small to characterize although statistically likely to be benign. A 0.6 by 0.4 similar lesion is present in the left mid upper kidney on image 29/7.  No urinary tract calculi, abnormal renal parenchymal enhancement, or abnormal urographic phase filling defect along the urothelium to explain the patient's hematuria.  She is not a smoker.      PMH: Past Medical History:  Diagnosis Date  . Hypertension     Surgical History: Past Surgical History:  Procedure Laterality Date  . BREAST BIOPSY Left 03/14/2019   [place] bx 12:00 coil marker, path pending  . BREAST BIOPSY Left 03/14/2019   [place] bx 3:00 heart marker,  path pending  . BREAST BIOPSY Right 03/14/2019   [place] bx ribbon marker path pending    Home Medications:  Allergies as of 08/01/2019   No Known Allergies     Medication List       Accurate as of August 01, 2019 11:59 PM. If you have any questions, ask your nurse or doctor.        Blood Pressure Kit 1 kit by Does not apply route 2 (two) times daily.   fluconazole 150 MG tablet Commonly known as: DIFLUCAN Take 1 tablet (150 mg total) by mouth once for 1 dose. Started by: Zara Council, PA-C   omeprazole 20 MG capsule Commonly known as: PRILOSEC Take 20 mg by mouth daily.       Allergies: No Known Allergies  Family History: Family History  Problem Relation Age of Onset  . Kidney disease Cousin     Social History:  reports that she has never smoked. She has never used smokeless tobacco. She reports that she does not drink alcohol and does not use drugs.  ROS: Pertinent ROS in HPI  Physical Exam: BP 104/68   Pulse 84   Ht 5' 2" (1.575 m)   Wt 176 lb (79.8 kg)   LMP 07/08/2019   BMI 32.19 kg/m   Constitutional:  Well nourished. Alert and oriented, No acute distress. HEENT: [place] AT, moist mucus membranes.  Trachea midline, no masses. Cardiovascular: No clubbing, cyanosis, or edema. Respiratory: Normal respiratory effort, no increased work of breathing. GI: Abdomen is soft, non tender, non distended, no abdominal masses. Liver and  spleen not palpable.  No hernias appreciated.  Stool sample for occult testing is not indicated.   GU: No CVA tenderness.  No bladder fullness or masses.  Nornal external genitalia, normal pubic hair distribution, no lesions.  Normal urethral meatus, no lesions, no prolapse, no discharge.   No urethral masses, tenderness and/or tenderness. No bladder fullness, tenderness or masses. Normal vagina mucosa, good estrogen effect, slight whitish discharge suspicious for yeast, no lesions, good pelvic support, no cystocele and no rectocele noted.  No  cervical motion tenderness.  Uterus is freely mobile and non-fixed.  No adnexal/parametria masses or tenderness noted.  Anus and perineum are without rashes or lesions.    Skin: No rashes, bruises or suspicious lesions. Lymph: No cervical or inguinal adenopathy. Neurologic: Grossly intact, no focal deficits, moving all 4 extremities. Psychiatric: Normal mood and affect.    Laboratory Data: Lab Results  Component Value Date   WBC 6.0 06/12/2019   HGB 12.3 06/12/2019   HCT 37.0 06/12/2019   MCV 88 06/12/2019   PLT 218 06/12/2019    Lab Results  Component Value Date   CREATININE 0.69 06/12/2019    Lab Results  Component Value Date   HGBA1C 5.3 06/12/2019    Lab Results  Component Value Date   TSH 3.310 06/12/2019       Component Value Date/Time   CHOL 221 (H) 07/03/2019 1117   HDL 36 (L) 07/03/2019 1117   CHOLHDL 6.1 (H) 07/03/2019 1117   LDLCALC 125 (H) 07/03/2019 1117    Lab Results  Component Value Date   AST 13 06/12/2019   Lab Results  Component Value Date   ALT 13 06/12/2019    Urinalysis Component     Latest Ref Rng & Units 08/01/2019  Specific Gravity, UA     1.005 - 1.030 1.025  pH, UA     5.0 - 7.5 5.5  Color, UA     Yellow Yellow  Appearance Ur     Clear Hazy (A)  Leukocytes,UA     Negative Negative  Protein,UA     Negative/Trace Negative  Glucose, UA     Negative Negative  Ketones, UA     Negative Negative  RBC, UA     Negative 2+ (A)  Bilirubin, UA     Negative Negative  Urobilinogen, Ur     0.2 - 1.0 mg/dL 0.2  Nitrite, UA     Negative Negative  Microscopic Examination      See below:   Component     Latest Ref Rng & Units 08/01/2019  WBC, UA     0 - 5 /hpf 0-5  RBC     0 - 2 /hpf 3-10 (A)  Epithelial Cells (non renal)     0 - 10 /hpf 0-10  Bacteria, UA     None seen/Few Few    I have reviewed the labs.   Pertinent Imaging: CLINICAL DATA:  Microscopic hematuria.  Dysuria.  EXAM: CT ABDOMEN AND PELVIS WITHOUT  AND WITH CONTRAST  TECHNIQUE: Multidetector CT imaging of the abdomen and pelvis was performed following the standard protocol before and following the bolus administration of intravenous contrast.  CONTRAST:  181m OMNIPAQUE IOHEXOL 300 MG/ML  SOLN  COMPARISON:  None.  FINDINGS: Lower chest: Nodularity in both breasts as detailed on recent mammography, and found to be benign in appearance at mammography.  Hepatobiliary: Diffuse hepatic steatosis. An 8 mm hypodense lesion on the portal venous phase images inferiorly in the right  hepatic lobe on image 48/7 appears to have delayed enhancement diffusely on image 40/13, favoring a small hemangioma.  A 0.7 by 0.6 cm hypodense lesion in the right hepatic lobe on image 23/7 is probably diffusely enhancing on delayed image 17/13 but is less technically specific due to small size.  A 3 mm hypodense lesion in the dome of the right hepatic lobe on image 16/7 is nonspecific due to small size, but highly likely to be benign  Hepatomegaly is present with the liver measuring 23.1 cm craniocaudad.  Contracted gallbladder  Pancreas: Mild fatty infiltration of the pancreatic head. This is present on the precontrast images and accordingly is not felt to represent hypoenhancement.  Spleen: Unremarkable.  No splenomegaly.  Adrenals/Urinary Tract: 2.0 by 1.3 cm left adrenal adenoma, precontrast density 6 Hounsfield units.  0.5 by 0.4 cm hypodense lesion of the left kidney upper pole on image 19/13 is technically too small to characterize although statistically likely to be benign. A 0.6 by 0.4 similar lesion is present in the left mid upper kidney on image 29/7.  No urinary tract calculi, abnormal renal parenchymal enhancement, or abnormal urographic phase filling defect along the urothelium to explain the patient's hematuria.  Stomach/Bowel: Unremarkable  Vascular/Lymphatic: Unremarkable  Reproductive: 4.1 by 3.5 by  4.2 cm fluid density cystic lesion of the right over, questionable marginal enhancement. Probably benign category. There is an IUD in the uterus with the stem in the cervix near the external os.  Other: No supplemental non-categorized findings.  Musculoskeletal: Faint sclerosis in the L1 vertebral body with some trabecular accentuation for example on image 26/7 favoring a hemangioma.  IMPRESSION: 1. A cause for hematuria is not identified. Two tiny hypodense lesions in the left kidney are likely benign cysts, although technically nonspecific due to very small size. 2. 2.0 cm left adrenal adenoma. 3. Fluid density 4.2 cm cystic lesion in the right ovary, probably benign cyst category, follow up pelvic ultrasound is recommended in 6-12 weeks. This recommendation follows ACR consensus guidelines: White Paper of the ACR Incidental Findings Committee II on Adnexal Findings. J Am Coll Radiol 2013:10:675-681. 4. Diffuse hepatic steatosis. 5. Small hemangioma in the right hepatic lobe. A second small lesion in the right hepatic lobe is also probably a hemangioma but technically nonspecific due to small size. If the patient has a history of gastrointestinal malignancy or abnormal liver enzymes, these lesions could be further characterized with hepatic protocol MRI with and without contrast. 6. Mild hepatomegaly.  No splenomegaly.   Electronically Signed   By: Van Clines M.D.   On: 03/27/2018 14:56 I have independently reviewed the films.  See HPI.  Assessment & Plan:    1. Microscopic hematuria - Urinalysis, Complete - CULTURE, URINE COMPREHENSIVE - Patient has completed a CT urogram in 03/2018 - no findings to explain hematuria - Patient will be scheduled for a cystoscopy to complete hematuria work up - I have explained to the patient that they will  be scheduled for a cystoscopy in our office to evaluate their bladder.  The cystoscopy consists of passing a tube with  a lens up through their urethra and into their urinary bladder.   We will inject the urethra with a lidocaine gel prior to introducing the cystoscope to help with any discomfort during the procedure.   After the procedure, they might experience blood in the urine and discomfort with urination.  This will abate after the first few voids.  I have  encouraged the patient to  increase water intake  during this time.  Patient denies any allergies to lidocaine.   2. Adrenal adenoma - Has appointment with Dr. Loanne Drilling at [facility] endocrinology on 09/11/2019  3. Right ovarian cyst - Has appointment with DR. Schuman at [facility]  4. Vaginal yeast infection Prescribed diflucan   Return for schedule cystoscopy with Dr. Diamantina Providence .  These notes generated with voice recognition software. I apologize for typographical errors.  Zara Council, PA-C  [facility] [address]  [place], [place] [address] [phone]  Interpreter ID [phone]

## 2019-08-05 LAB — MICROSCOPIC EXAMINATION

## 2019-08-05 LAB — URINALYSIS, COMPLETE
Bilirubin, UA: NEGATIVE
Glucose, UA: NEGATIVE
Ketones, UA: NEGATIVE
Leukocytes,UA: NEGATIVE
Nitrite, UA: NEGATIVE
Protein,UA: NEGATIVE
Specific Gravity, UA: 1.025 (ref 1.005–1.030)
Urobilinogen, Ur: 0.2 mg/dL (ref 0.2–1.0)
pH, UA: 5.5 (ref 5.0–7.5)

## 2019-08-08 ENCOUNTER — Telehealth: Payer: Self-pay | Admitting: Family Medicine

## 2019-08-08 LAB — CULTURE, URINE COMPREHENSIVE

## 2019-08-08 MED ORDER — SULFAMETHOXAZOLE-TRIMETHOPRIM 800-160 MG PO TABS: 1.0000 | ORAL_TABLET | Freq: Two times a day (BID) | ORAL | 0 refills | Status: DC

## 2019-08-08 NOTE — Telephone Encounter (Signed)
-----   Message from Nori Riis, PA-C sent at 08/08/2019  1:55 PM EDT ----- Please have Mrs. Bamberg start Septra DS, BID x 7 day due to the positive urine culture in preparation for her cystoscopy.

## 2019-08-08 NOTE — Telephone Encounter (Signed)
LMOM patient notified to pick up ABX for the positive urine culture.

## 2019-08-12 ENCOUNTER — Encounter: Payer: Self-pay | Admitting: Obstetrics and Gynecology

## 2019-08-12 ENCOUNTER — Ambulatory Visit (INDEPENDENT_AMBULATORY_CARE_PROVIDER_SITE_OTHER): Payer: Self-pay | Admitting: Obstetrics and Gynecology

## 2019-08-12 ENCOUNTER — Other Ambulatory Visit: Payer: Self-pay

## 2019-08-12 ENCOUNTER — Ambulatory Visit (INDEPENDENT_AMBULATORY_CARE_PROVIDER_SITE_OTHER): Payer: Self-pay

## 2019-08-12 VITALS — BP 115/70 | HR 88 | Resp 18 | Ht 63.0 in | Wt 178.6 lb

## 2019-08-12 DIAGNOSIS — N83201 Unspecified ovarian cyst, right side: Secondary | ICD-10-CM

## 2019-08-12 DIAGNOSIS — T8332XA Displacement of intrauterine contraceptive device, initial encounter: Secondary | ICD-10-CM

## 2019-08-12 DIAGNOSIS — N920 Excessive and frequent menstruation with regular cycle: Secondary | ICD-10-CM

## 2019-08-12 DIAGNOSIS — Z30433 Encounter for removal and reinsertion of intrauterine contraceptive device: Secondary | ICD-10-CM

## 2019-08-12 NOTE — Progress Notes (Signed)
Patient ID: Nicole Lambert, female   DOB: 1975/08/14, 44 y.o.   MRN: [id number]  Reason for Consult: Gynecologic Exam   Referred by Langston Reusing, NP  Subjective:     HPI:  Nicole Lambert is a 44 y.o. female.  [facility].  4 cm right ovarian cyst was seen in the right ovary on previous abdominal pelvic CT on 03/27/2018.  Follow-up was recommended in 6 to 12 weeks.  Since that time she has not had a pelvic ultrasound.  Pelvic ultrasound today in office showed that the right ovarian cyst has resolved.  The IUD was noted to be in the lower uterine segment and the cervix.  This same finding was seen on the CT scan.  Discussed with the patient.  She reports that the IUD has been in place for 6 years and is ParaGard.  She reports that she has been having extended periods of bleeding lasting 10 days and she would like to have the IUD removed and replaced with a Mirena IUD.  Pap smear is up-to-date.  03/21/2018.  NIL Pap smear. HPV negative.   Past Medical History:  Diagnosis Date  . Hypertension    Family History  Problem Relation Age of Onset  . Kidney disease Cousin    Past Surgical History:  Procedure Laterality Date  . BREAST BIOPSY Left 03/14/2019   [place] bx 12:00 coil marker, path pending  . BREAST BIOPSY Left 03/14/2019   [place] bx 3:00 heart marker, path pending  . BREAST BIOPSY Right 03/14/2019   [place] bx ribbon marker path pending    Short Social History:  Social History   Tobacco Use  . Smoking status: Never Smoker  . Smokeless tobacco: Never Used  Substance Use Topics  . Alcohol use: Never    No Known Allergies  Current Outpatient Medications  Medication Sig Dispense Refill  . Blood Pressure KIT 1 kit by Does not apply route 2 (two) times daily. 1 kit 0   No current facility-administered medications for this visit.    Review of Systems  Constitutional: Negative for chills, fatigue, fever and  unexpected weight change.  HENT: Negative for trouble swallowing.  Eyes: Negative for loss of vision.  Respiratory: Negative for cough, shortness of breath and wheezing.  Cardiovascular: Negative for chest pain, leg swelling, palpitations and syncope.  GI: Negative for abdominal pain, blood in stool, diarrhea, nausea and vomiting.  GU: Negative for difficulty urinating, dysuria, frequency and hematuria.  Musculoskeletal: Negative for back pain, leg pain and joint pain.  Skin: Negative for rash.  Neurological: Negative for dizziness, headaches, light-headedness, numbness and seizures.  Psychiatric: Negative for behavioral problem, confusion, depressed mood and sleep disturbance.        Objective:  Objective   Vitals:   08/12/19 1326  BP: 115/70  Pulse: 88  Resp: 18  SpO2: 98%  Weight: 178 lb 9.6 oz (81 kg)  Height: '5\' 3"'$  (1.6 m)   Body mass index is 31.64 kg/m.  Physical Exam Vitals and nursing note reviewed.  Constitutional:      Appearance: She is well-developed.  HENT:     Head: Normocephalic and atraumatic.  Eyes:     Pupils: Pupils are equal, round, and reactive to light.  Cardiovascular:     Rate and Rhythm: Normal rate and regular rhythm.  Pulmonary:     Effort: Pulmonary effort is normal. No respiratory distress.  Skin:    General:  Skin is warm and dry.  Neurological:     Mental Status: She is alert and oriented to person, place, and time.  Psychiatric:        Behavior: Behavior normal.        Thought Content: Thought content normal.        Judgment: Judgment normal.    IUD placed approximately 6 years ago. Since that time, she states that she has noticed and increase in the amount of vaginal bleeding and pain. She wishes to have her IUD removed and replaced with a new  IUD.   Discussed risks of irregular bleeding, cramping, infection, malpositioning or misplacement of the IUD outside the uterus which may require further procedure such as laparoscopy, risk  of failure <1%. Time out was performed.   Patient identified, informed consent performed, consent signed.     A bimanual exam showed the uterus to be anteverted.  Speculum placed in the vagina.  Cervix visualized.    IUD Removal Strings of IUD identified and grasped.  IUD removed without problem.  Pt tolerated this well.  IUD noted to be intact (Paragard).  IUD Placement Cleaned with Betadine x 2.  Cervix Grasped anteriorly with a single tooth tenaculum.  Uterus sounded to 7 cm.   Mirena IUD placed per manufacturer's recommendations.  Strings trimmed to 3 cm. Tenaculum was removed, good hemostasis noted.  Patient tolerated procedure well.   Patient was given post-procedure instructions. Patient was also asked to check IUD strings periodically and follow up in 4 weeks for IUD check.      Assessment/Plan:     44 yo with right ovarian cyst 1. Right ovarian cyst has resolved on today's pelvic US.  2. IUD removal and reinsertion- performed today  More than 30 minutes were spent face to face with the patient in the room, reviewing the medical record, labs and images, and coordinating care for the patient. The plan of management was discussed in detail and counseling was provided.      Adrian Prows MD [facility] 08/12/2019 1:36 PM

## 2019-08-21 ENCOUNTER — Other Ambulatory Visit: Payer: Self-pay | Admitting: Urology

## 2019-08-29 ENCOUNTER — Other Ambulatory Visit: Payer: Self-pay

## 2019-08-29 ENCOUNTER — Ambulatory Visit: Payer: Self-pay | Admitting: Urology

## 2019-08-30 LAB — URINALYSIS, COMPLETE
Bilirubin, UA: NEGATIVE
Glucose, UA: NEGATIVE
Ketones, UA: NEGATIVE
Leukocytes,UA: NEGATIVE
Nitrite, UA: NEGATIVE
Protein,UA: NEGATIVE
Specific Gravity, UA: 1.02 (ref 1.005–1.030)
Urobilinogen, Ur: 0.2 mg/dL (ref 0.2–1.0)
pH, UA: 5 (ref 5.0–7.5)

## 2019-08-30 LAB — MICROSCOPIC EXAMINATION: Bacteria, UA: NONE SEEN

## 2019-09-02 ENCOUNTER — Ambulatory Visit
Admission: RE | Admit: 2019-09-02 | Discharge: 2019-09-02 | Disposition: A | Discharge status: Home / Self Care | Payer: Self-pay | Source: Ambulatory Visit | Attending: Oncology | Admitting: Oncology

## 2019-09-02 ENCOUNTER — Encounter: Payer: Self-pay | Admitting: Radiology

## 2019-09-02 DIAGNOSIS — N63 Unspecified lump in unspecified breast: Secondary | ICD-10-CM | POA: Insufficient documentation

## 2019-09-10 ENCOUNTER — Other Ambulatory Visit: Payer: Self-pay

## 2019-09-10 ENCOUNTER — Encounter: Payer: Self-pay | Admitting: Obstetrics and Gynecology

## 2019-09-10 ENCOUNTER — Ambulatory Visit (INDEPENDENT_AMBULATORY_CARE_PROVIDER_SITE_OTHER): Payer: Self-pay | Admitting: Obstetrics and Gynecology

## 2019-09-10 VITALS — BP 106/70 | Ht 62.0 in | Wt 180.4 lb

## 2019-09-10 DIAGNOSIS — Z30431 Encounter for routine checking of intrauterine contraceptive device: Secondary | ICD-10-CM

## 2019-09-10 NOTE — Progress Notes (Signed)
Patient ID: Nicole Lambert, female   DOB: Sep 21, 1975, 44 y.o.   MRN: [id number]  Reason for Consult: Gynecologic Exam (pt states she has vaginal bleeding 15 days. pt states her breast are very tender and sore as well.)   Referred by [place], Chioma E, NP  Subjective:     HPI:  Nicole Lambert is a 44 y.o. female patient reports that she has had small amounts of daily spotting with IUD in place.  She has checked in the vagina is able to feel the strings.  She does have some nipple sensitivity and some breast sensitivity.  She does not have any additional complaints or concerns.  She is not having pelvic pain.  She declines pelvic examination today to check the strings stating that she can feel the strings with her finger in the vagina.   Past Medical History:  Diagnosis Date  . Hypertension    Family History  Problem Relation Age of Onset  . Kidney disease Cousin    Past Surgical History:  Procedure Laterality Date  . BREAST BIOPSY Left 03/14/2019   [place] bx 12:00 coil marker, PSEUDOANGIOMATOUS STROMAL HYPERPLASIA   . BREAST BIOPSY Left 03/14/2019   [place] bx 3:00 PSEUDOANGIOMATOUS STROMAL HYPERPLASIA   . BREAST BIOPSY Right 03/14/2019   [place] bx ribbon marker path pending    Short Social History:  Social History   Tobacco Use  . Smoking status: Never Smoker  . Smokeless tobacco: Never Used  Substance Use Topics  . Alcohol use: Never    No Known Allergies  Current Outpatient Medications  Medication Sig Dispense Refill  . Blood Pressure KIT 1 kit by Does not apply route 2 (two) times daily. 1 kit 0   No current facility-administered medications for this visit.    Review of Systems  Constitutional: Negative for chills, fatigue, fever and unexpected weight change.  HENT: Negative for trouble swallowing.  Eyes: Negative for loss of vision.  Respiratory: Negative for cough, shortness of breath and wheezing.  Cardiovascular: Negative for chest pain, leg swelling, palpitations  and syncope.  GI: Negative for abdominal pain, blood in stool, diarrhea, nausea and vomiting.  GU: Negative for difficulty urinating, dysuria, frequency and hematuria.  Musculoskeletal: Negative for back pain, leg pain and joint pain.  Skin: Negative for rash.  Neurological: Negative for dizziness, headaches, light-headedness, numbness and seizures.  Psychiatric: Negative for behavioral problem, confusion, depressed mood and sleep disturbance.        Objective:  Objective   Vitals:   09/10/19 1431  BP: 106/70  Weight: 180 lb 6.4 oz (81.8 kg)  Height: '5\' 2"'  (1.575 m)   Body mass index is 33 kg/m.  Physical Exam : Declined      Assessment/Plan:     44 year old with IUD placement 1 month ago.  Following up today for string check.  Patient declines pelvic exam.  But reports that she can feel the strings vaginally.  Discussed expected symptoms of the IUD placement.  Discussed that generally it will take 3 to 6 months for vaginal spotting to resolve.  Discussed that a lighter period is to be expected with the hormonal IUD.  Discussed that she may experience some breast tenderness as well.  Reassurance given.  All questions answered.  We will follow up in 1 year for IUD check.    More than 15 minutes were spent face to face with the patient in the room, reviewing the medical record, labs and images, and coordinating care for the patient.  The plan of management was discussed in detail and counseling was provided.      Adrian Prows MD [facility] 09/10/2019 3:07 PM

## 2019-09-11 ENCOUNTER — Encounter: Payer: Self-pay | Admitting: Endocrinology

## 2019-09-11 ENCOUNTER — Ambulatory Visit (INDEPENDENT_AMBULATORY_CARE_PROVIDER_SITE_OTHER): Payer: Self-pay | Admitting: Endocrinology

## 2019-09-11 ENCOUNTER — Other Ambulatory Visit: Payer: Self-pay

## 2019-09-11 DIAGNOSIS — E278 Other specified disorders of adrenal gland: Secondary | ICD-10-CM

## 2019-09-11 MED ORDER — DEXAMETHASONE 1 MG PO TABS: 1.0000 mg | ORAL_TABLET | ORAL | 0 refills | Status: DC

## 2019-09-11 NOTE — Patient Instructions (Addendum)
CT:  Let's recheck the CT. You are scheduled as follows:  Monday, August 9th, 2021 Arrive at 1:15pm. Exam will begin at 1:30pm [place], [place] If you need to reschedule, please call [phone]  Instructions for your CT:  Nothing to eat or drink 4 hours before the test. Please go to [facility] to pick up your liquid contrast.   CONTRAST:  Drink 1 bottle 2 hours before your test, then drink the reaming bottle 1 hour before the test.   LABS:  You should do a "dexamethasone suppression test." For this, you would take dexamethasone 1 mg at 10 pm. I have sent a prescription to your pharmacy. Please pick up this medication.   You do not need to be fasting for this test. Your "cortisol" blood test has been scheduled as follows:  Friday, July 30th, 2021 Arrive at 9:00am for check in. Your labs will be drawn at 9:15am. If you need to reschedule your appointment, please call [phone] and request to speak with Lorriane Shire. Location: [facility], [facility] [address] [place], [place] [address]  If these are normal, no further testing is needed.        .     :   9  2021   1:15  .     1:30  2903                     [phone]     :      4   .    [place]  :               .  :     "  ".      1    10 .        .    .          .      ""     :   30  2021   9:00   .        9:15 .            [phone]    . Velora Heckler [facility] [phone]            .

## 2019-09-11 NOTE — Progress Notes (Addendum)
Subjective:    Patient ID: Nicole Lambert, female    DOB: 10-25-1975, 44 y.o.   MRN: [id number]  HPI Video interpreter.  Pt is ref by Zara Council, PA.  She was noted to have adrenal adenoma in 2020.  She has no h/o adrenal disorder.  She has reg menses.   Past Medical History:  Diagnosis Date  . Hypertension     Past Surgical History:  Procedure Laterality Date  . BREAST BIOPSY Left 03/14/2019   [place] bx 12:00 coil marker, PSEUDOANGIOMATOUS STROMAL HYPERPLASIA   . BREAST BIOPSY Left 03/14/2019   [place] bx 3:00 PSEUDOANGIOMATOUS STROMAL HYPERPLASIA   . BREAST BIOPSY Right 03/14/2019   [place] bx ribbon marker path pending    Social History   Socioeconomic History  . Marital status: Married    Spouse name: Not on file  . Number of children: 4  . Years of education: Not on file  . Highest education level: Bachelor's degree (e.g., BA, AB, BS)  Occupational History  . Occupation: unemployed  Tobacco Use  . Smoking status: Never Smoker  . Smokeless tobacco: Never Used  Vaping Use  . Vaping Use: Never used  Substance and Sexual Activity  . Alcohol use: Never  . Drug use: Never  . Sexual activity: Yes    Birth control/protection: I.U.D.  Other Topics Concern  . Not on file  Social History Narrative  . Not on file   Social Determinants of Health   Financial Resource Strain: Low Risk   . Difficulty of Paying Living Expenses: Not hard at all  Food Insecurity:   . Worried About Charity fundraiser in the Last Year:   . Arboriculturist in the Last Year:   Transportation Needs:   . Film/video editor (Medical):   Marland Kitchen Lack of Transportation (Non-Medical):   Physical Activity: Unknown  . Days of Exercise per Week: 0 days  . Minutes of Exercise per Session: Not on file  Stress:   . Feeling of Stress :   Social Connections:   . Frequency of Communication with Friends and Family:   . Frequency of Social Gatherings with Friends and Family:   . Attends Religious Services:   .  Active Member of Clubs or Organizations:   . Attends Archivist Meetings:   Marland Kitchen Marital Status:   Intimate Partner Violence: Not At Risk  . Fear of Current or Ex-Partner: No  . Emotionally Abused: No  . Physically Abused: No  . Sexually Abused: No    No current outpatient medications on file prior to visit.   No current facility-administered medications on file prior to visit.    No Known Allergies  Family History  Problem Relation Age of Onset  . Kidney disease Cousin   . Adrenal disorder Neg Hx     BP 120/80   Pulse 79   Ht 5\' 2"  (1.575 m)   Wt 179 lb (81.2 kg)   SpO2 99%   BMI 32.74 kg/m    Review of Systems denies weight change, headache, hirsutism, easy bruising, and rash on the abdomen.  denies flushing, pallor, palpitations, and fever.  She has gained a few lbs.  She has sweating and heat intolerance.      Objective:   Physical Exam VS: see vs page GEN: no distress HEAD: head: no deformity eyes: no periorbital swelling, no proptosis external nose and ears are normal NECK: supple, thyroid is not enlarged CHEST WALL: no deformity LUNGS: clear  to auscultation CV: reg rate and rhythm, no murmur.  MUSCULOSKELETAL: muscle bulk and strength are grossly normal.  no obvious joint swelling.  gait is normal and steady.   EXTEMITIES: no deformity.  Trace bilat leg edema. NEURO:  cn 2-12 grossly intact.   readily moves all 4's.  sensation is intact to touch on all 4's.  No tremor SKIN:  Normal texture and temperature.  No rash or suspicious lesion is visible.  Not diaphoretic NODES:  None palpable at the neck.   PSYCH: alert, well-oriented.  Does not appear anxious nor depressed.  Lab Results  Component Value Date   TSH 3.310 06/12/2019   I have reviewed outside records, and summarized: Pt was noted to have adrenal adenoma, and referred here.  Pt was seen for urologic sxs, and CT was ordered.  Adrenal adenoma was incidentally noted  CT (2020): 2 cm left  adrenal adenoma.    Assessment & Plan:  Adrenal adenoma, new to me.  I have sent a prescription to your pharmacy, for the decadron. check labs.  Recheck CT.   Patient Instructions  CT:  Let's recheck the CT. You are scheduled as follows:  Monday, August 9th, 2021 Arrive at 1:15pm. Exam will begin at 1:30pm [place], [place] If you need to reschedule, please call [phone]  Instructions for your CT:  Nothing to eat or drink 4 hours before the test. Please go to [facility] to pick up your liquid contrast.   CONTRAST:  Drink 1 bottle 2 hours before your test, then drink the reaming bottle 1 hour before the test.   LABS:  You should do a "dexamethasone suppression test." For this, you would take dexamethasone 1 mg at 10 pm. I have sent a prescription to your pharmacy. Please pick up this medication.   You do not need to be fasting for this test. Your "cortisol" blood test has been scheduled as follows:  Friday, July 30th, 2021 Arrive at 9:00am for check in. Your labs will be drawn at 9:15am. If you need to reschedule your appointment, please call [phone] and request to speak with Lorriane Shire. Location: [facility], [facility] [address], [place], [place] [address]  If these are normal, no further testing is needed.        .     :   9  2021   1:15  .     1:30  [id number]                     [phone]     :      4   .    [place]  :                .  :     "  ".      1    10 .        .    .         .      ""     :   30  2021   9:00   .        9:15 .            [phone]    . : [place]  [id number]            .   

## 2019-09-13 ENCOUNTER — Other Ambulatory Visit (INDEPENDENT_AMBULATORY_CARE_PROVIDER_SITE_OTHER): Payer: Self-pay

## 2019-09-13 ENCOUNTER — Other Ambulatory Visit: Payer: Self-pay

## 2019-09-13 DIAGNOSIS — E278 Other specified disorders of adrenal gland: Secondary | ICD-10-CM

## 2019-09-13 LAB — BASIC METABOLIC PANEL
BUN: 8 mg/dL (ref 6–23)
CO2: 26 mEq/L (ref 19–32)
Calcium: 10.1 mg/dL (ref 8.4–10.5)
Chloride: 106 mEq/L (ref 96–112)
Creatinine, Ser: 0.65 mg/dL (ref 0.40–1.20)
GFR: 97.91 mL/min (ref >60.00)
Glucose, Bld: 149 mg/dL — ABNORMAL HIGH (ref 70–99)
Potassium: 4.3 mEq/L (ref 3.5–5.1)
Sodium: 142 mEq/L (ref 135–145)

## 2019-09-13 LAB — CORTISOL: Cortisol, Plasma: 0.8 ug/dL

## 2019-09-18 ENCOUNTER — Other Ambulatory Visit: Payer: Self-pay | Admitting: Urology

## 2019-09-22 ENCOUNTER — Telehealth: Payer: Self-pay | Admitting: Endocrinology

## 2019-09-22 NOTE — Telephone Encounter (Signed)
please contact lab I ordered renin/aldosterone, but only renin is reported. Is it going to be resulted?

## 2019-09-23 ENCOUNTER — Other Ambulatory Visit: Payer: Self-pay

## 2019-09-23 ENCOUNTER — Ambulatory Visit
Admission: RE | Admit: 2019-09-23 | Discharge: 2019-09-23 | Disposition: A | Discharge status: Home / Self Care | Payer: Self-pay | Source: Ambulatory Visit | Attending: Endocrinology | Admitting: Endocrinology

## 2019-09-23 DIAGNOSIS — E278 Other specified disorders of adrenal gland: Secondary | ICD-10-CM | POA: Insufficient documentation

## 2019-09-23 LAB — CATECHOLAMINES, FRACTIONATED, PLASMA
Dopamine: <10 pg/mL
Epinephrine: <20 pg/mL
Norepinephrine: 302 pg/mL
Total Catecholamines: 302 pg/mL

## 2019-09-23 LAB — ALDOSTERONE + RENIN ACTIVITY W/ RATIO
ALDO / PRA Ratio: 36.4 Ratio — ABNORMAL HIGH (ref 0.9–28.9)
Aldosterone: 4 ng/dL
Renin Activity: 0.11 ng/mL/h — ABNORMAL LOW (ref 0.25–5.82)

## 2019-09-23 NOTE — Telephone Encounter (Signed)
Routing this message to our phlebotomist for follow up.

## 2019-09-24 ENCOUNTER — Telehealth: Payer: Self-pay

## 2019-09-24 ENCOUNTER — Encounter: Payer: Self-pay | Admitting: Endocrinology

## 2019-09-24 NOTE — Telephone Encounter (Signed)
-----   Message from Renato Shin, MD sent at 09/24/2019 10:44 AM EDT ----- I printed letter.  Please send.  Thank you.

## 2019-09-24 NOTE — Telephone Encounter (Signed)
At Dr. Ellison's request, letter has been mailed 

## 2019-09-24 NOTE — Telephone Encounter (Signed)
-----   Message from Renato Shin, MD sent at 09/23/2019  3:24 PM EDT ----- please contact patient: Normal--good

## 2019-09-24 NOTE — Telephone Encounter (Signed)
RESULTS  Results were reviewed by Dr. Loanne Drilling. A letter, converted to Arabic, has been mailed to pt home address. For future reference, letter can be found in [place].

## 2019-09-26 ENCOUNTER — Ambulatory Visit (INDEPENDENT_AMBULATORY_CARE_PROVIDER_SITE_OTHER): Payer: Self-pay | Admitting: Urology

## 2019-09-26 ENCOUNTER — Other Ambulatory Visit: Payer: Self-pay

## 2019-09-26 ENCOUNTER — Encounter: Payer: Self-pay | Admitting: Urology

## 2019-09-26 VITALS — BP 118/83 | HR 91 | Ht 62.0 in | Wt 178.0 lb

## 2019-09-26 DIAGNOSIS — R3129 Other microscopic hematuria: Secondary | ICD-10-CM

## 2019-09-26 NOTE — Progress Notes (Signed)
Cystoscopy Procedure Note:  Indication: Microscopic hematuria  After informed consent and discussion of the procedure and its risks, Nicole Lambert was positioned and prepped in the standard fashion. Cystoscopy was performed with a flexible cystoscope. The urethra, bladder neck and entire bladder was visualized in a standard fashion. The ureteral orifices were visualized in their normal location and orientation.  No abnormalities on retroflexion.  Bladder mucosa normal throughout.  Imaging: No abnormalities on CT urogram from February 2020  Findings: Normal cystoscopy  Assessment and Plan: Negative microscopic hematuria work-up, follow-up as needed, return precautions discussed  Nicole Madrid, MD 09/26/2019

## 2019-09-27 LAB — MICROSCOPIC EXAMINATION: Epithelial Cells (non renal): >10 /hpf — ABNORMAL HIGH (ref 0–10)

## 2019-09-27 LAB — URINALYSIS, COMPLETE
Bilirubin, UA: NEGATIVE
Glucose, UA: NEGATIVE
Ketones, UA: NEGATIVE
Leukocytes,UA: NEGATIVE
Nitrite, UA: NEGATIVE
Protein,UA: NEGATIVE
Specific Gravity, UA: 1.025 (ref 1.005–1.030)
Urobilinogen, Ur: 0.2 mg/dL (ref 0.2–1.0)
pH, UA: 6 (ref 5.0–7.5)

## 2019-10-10 ENCOUNTER — Other Ambulatory Visit: Payer: Self-pay | Admitting: *Deleted

## 2019-10-10 DIAGNOSIS — N63 Unspecified lump in unspecified breast: Secondary | ICD-10-CM

## 2019-10-17 ENCOUNTER — Ambulatory Visit: Payer: Self-pay | Admitting: Family Medicine

## 2019-10-17 ENCOUNTER — Other Ambulatory Visit: Payer: Self-pay

## 2019-10-17 VITALS — BP 131/83 | HR 72 | Ht 62.0 in | Wt 180.0 lb

## 2019-10-17 DIAGNOSIS — M5432 Sciatica, left side: Secondary | ICD-10-CM

## 2019-10-17 DIAGNOSIS — Z09 Encounter for follow-up examination after completed treatment for conditions other than malignant neoplasm: Secondary | ICD-10-CM

## 2019-10-17 NOTE — Progress Notes (Signed)
  [facility]   Progress Note: General Provider: Lanae Boast, FNP  SUBJECTIVE:   Nicole Lambert is a 44 y.o. female who  has a past medical history of Hypertension.. Patient presents today for Follow-up (review CT abdomen) Patient reports that she was informed of her CT results. Her adrenal nodules are stable. She reports left sided lower back pain intermittently after doing household chores. She reports that it is relieved with ibuprofen and rest. She denies any other problems or concerns today.   Review of Systems  Constitutional: Negative.   HENT: Negative.   Eyes: Negative.   Respiratory: Negative.   Cardiovascular: Negative.   Gastrointestinal: Negative.   Genitourinary: Negative.   Musculoskeletal: Positive for back pain (intermittent left side).  Skin: Negative.   Neurological: Negative.   Psychiatric/Behavioral: Negative.      OBJECTIVE: BP 131/83 (BP Location: Right Arm, Patient Position: Sitting)   Pulse 72   Ht 5\' 2"  (1.575 m)   Wt 180 lb (81.6 kg)   SpO2 98%   BMI 32.92 kg/m   Wt Readings from Last 3 Encounters:  10/17/19 180 lb (81.6 kg)  09/26/19 178 lb (80.7 kg)  09/11/19 179 lb (81.2 kg)     Physical Exam Constitutional:      General: She is not in acute distress.    Appearance: Normal appearance.  HENT:     Head: Normocephalic and atraumatic.  Cardiovascular:     Rate and Rhythm: Normal rate.     Pulses: Normal pulses.  Pulmonary:     Effort: Pulmonary effort is normal.  Musculoskeletal:        General: Normal range of motion.  Neurological:     Mental Status: She is alert and oriented to person, place, and time.  Psychiatric:        Mood and Affect: Mood normal.        Behavior: Behavior normal.     ASSESSMENT/PLAN: 1. Follow up - CBC With Differential; Future - Comprehensive metabolic panel; Future - Lipid Panel With LDL/HDL Ratio; Future - TSH; Future - Hemoglobin A1c; Future CT abdomen reviewed. Answered  questions  2. Sciatica of left side Discussed stretches and resting in between activities. Continue with OTC ibuprofen as needed.    Return in about 3 months (around 01/16/2020), or labs one week prior.    The patient was given clear instructions to go to ER or return to medical center if symptoms do not improve, worsen or new problems develop. The patient verbalized understanding and agreed with plan of care.   Ms. Doug Sou. Nathaneil Canary, FNP-BC [place]

## 2019-10-17 NOTE — Patient Instructions (Signed)
Sciatica  Sciatica is pain, weakness, tingling, or loss of feeling (numbness) along the sciatic nerve. The sciatic nerve starts in the lower back and goes down the back of each leg. Sciatica usually goes away on its own or with treatment. Sometimes, sciatica may come back (recur). What are the causes? This condition happens when the sciatic nerve is pinched or has pressure put on it. This may be the result of:  A disk in between the bones of the spine bulging out too far (herniated disk).  Changes in the spinal disks that occur with aging.  A condition that affects a muscle in the butt.  Extra bone growth near the sciatic nerve.  A break (fracture) of the area between your hip bones (pelvis).  Pregnancy.  Tumor. This is rare. What increases the risk? You are more likely to develop this condition if you:  Play sports that put pressure or stress on the spine.  Have poor strength and ease of movement (flexibility).  Have had a back injury in the past.  Have had back surgery.  Sit for long periods of time.  Do activities that involve bending or lifting over and over again.  Are very overweight (obese). What are the signs or symptoms? Symptoms can vary from mild to very bad. They may include:  Any of these problems in the lower back, leg, hip, or butt: ? Mild tingling, loss of feeling, or dull aches. ? Burning sensations. ? Sharp pains.  Loss of feeling in the back of the calf or the sole of the foot.  Leg weakness.  Very bad back pain that makes it hard to move. These symptoms may get worse when you cough, sneeze, or laugh. They may also get worse when you sit or stand for long periods of time. How is this treated? This condition often gets better without any treatment. However, treatment may include:  Changing or cutting back on physical activity when you have pain.  Doing exercises and stretching.  Putting ice or heat on the affected area.  Medicines that  help: ? To relieve pain and swelling. ? To relax your muscles.  Shots (injections) of medicines that help to relieve pain, irritation, and swelling.  Surgery. Follow these instructions at home: Medicines  Take over-the-counter and prescription medicines only as told by your doctor.  Ask your doctor if the medicine prescribed to you: ? Requires you to avoid driving or using heavy machinery. ? Can cause trouble pooping (constipation). You may need to take these steps to prevent or treat trouble pooping:  Drink enough fluids to keep your pee (urine) pale yellow.  Take over-the-counter or prescription medicines.  Eat foods that are high in fiber. These include beans, whole grains, and fresh fruits and vegetables.  Limit foods that are high in fat and sugar. These include fried or sweet foods. Managing pain      If told, put ice on the affected area. ? Put ice in a plastic bag. ? Place a towel between your skin and the bag. ? Leave the ice on for 20 minutes, 2-3 times a day.  If told, put heat on the affected area. Use the heat source that your doctor tells you to use, such as a moist heat pack or a heating pad. ? Place a towel between your skin and the heat source. ? Leave the heat on for 20-30 minutes. ? Remove the heat if your skin turns bright red. This is very important if you are   unable to feel pain, heat, or cold. You may have a greater risk of getting burned. Activity   Return to your normal activities as told by your doctor. Ask your doctor what activities are safe for you.  Avoid activities that make your symptoms worse.  Take short rests during the day. ? When you rest for a long time, do some physical activity or stretching between periods of rest. ? Avoid sitting for a long time without moving. Get up and move around at least one time each hour.  Exercise and stretch regularly, as told by your doctor.  Do not lift anything that is heavier than 10 lb (4.5 kg)  while you have symptoms of sciatica. ? Avoid lifting heavy things even when you do not have symptoms. ? Avoid lifting heavy things over and over.  When you lift objects, always lift in a way that is safe for your body. To do this, you should: ? Bend your knees. ? Keep the object close to your body. ? Avoid twisting. General instructions  Stay at a healthy weight.  Wear comfortable shoes that support your feet. Avoid wearing high heels.  Avoid sleeping on a mattress that is too soft or too hard. You might have less pain if you sleep on a mattress that is firm enough to support your back.  Keep all follow-up visits as told by your doctor. This is important. Contact a doctor if:  You have pain that: ? Wakes you up when you are sleeping. ? Gets worse when you lie down. ? Is worse than the pain you have had in the past. ? Lasts longer than 4 weeks.  You lose weight without trying. Get help right away if:  You cannot control when you pee (urinate) or poop (have a bowel movement).  You have weakness in any of these areas and it gets worse: ? Lower back. ? The area between your hip bones. ? Butt. ? Legs.  You have redness or swelling of your back.  You have a burning feeling when you pee. Summary  Sciatica is pain, weakness, tingling, or loss of feeling (numbness) along the sciatic nerve.  This condition happens when the sciatic nerve is pinched or has pressure put on it.  Sciatica can cause pain, tingling, or loss of feeling (numbness) in the lower back, legs, hips, and butt.  Treatment often includes rest, exercise, medicines, and putting ice or heat on the affected area. This information is not intended to replace advice given to you by your health care provider. Make sure you discuss any questions you have with your health care provider. Document Revised: 02/19/2018 Document Reviewed: 02/19/2018 Elsevier Patient Education  2020 Elsevier Inc.  

## 2019-10-18 ENCOUNTER — Encounter: Payer: Self-pay | Admitting: *Deleted

## 2019-10-18 NOTE — Progress Notes (Signed)
Letter mailed to inform patient of her next [facility] appointment and mammogram on 03/11/20 @1 :00.

## 2020-01-02 ENCOUNTER — Other Ambulatory Visit: Payer: Self-pay

## 2020-01-02 DIAGNOSIS — Z09 Encounter for follow-up examination after completed treatment for conditions other than malignant neoplasm: Secondary | ICD-10-CM

## 2020-01-02 NOTE — Progress Notes (Unsigned)
a1c

## 2020-01-03 LAB — CBC WITH DIFFERENTIAL
Basophils Absolute: 0 10*3/uL (ref 0.0–0.2)
Basos: 0 %
EOS (ABSOLUTE): 0.1 10*3/uL (ref 0.0–0.4)
Eos: 1 %
Hematocrit: 40.2 % (ref 34.0–46.6)
Hemoglobin: 14.3 g/dL (ref 11.1–15.9)
Immature Grans (Abs): 0 10*3/uL (ref 0.0–0.1)
Immature Granulocytes: 0 %
Lymphocytes Absolute: 2 10*3/uL (ref 0.7–3.1)
Lymphs: 31 %
MCH: 30.3 pg (ref 26.6–33.0)
MCHC: 35.6 g/dL (ref 31.5–35.7)
MCV: 85 fL (ref 79–97)
Monocytes Absolute: 0.4 10*3/uL (ref 0.1–0.9)
Monocytes: 7 %
Neutrophils Absolute: 3.7 10*3/uL (ref 1.4–7.0)
Neutrophils: 61 %
RBC: 4.72 x10E6/uL (ref 3.77–5.28)
RDW: 12.4 % (ref 11.7–15.4)
WBC: 6.2 10*3/uL (ref 3.4–10.8)

## 2020-01-03 LAB — HEMOGLOBIN A1C
Est. average glucose Bld gHb Est-mCnc: 114 mg/dL
Est. average glucose Bld gHb Est-mCnc: 114 mg/dL
Hgb A1c MFr Bld: 5.6 % (ref 4.8–5.6)
Hgb A1c MFr Bld: 5.6 % (ref 4.8–5.6)

## 2020-01-03 LAB — LIPID PANEL WITH LDL/HDL RATIO
Cholesterol, Total: 215 mg/dL — ABNORMAL HIGH (ref 100–199)
HDL: 33 mg/dL — ABNORMAL LOW (ref >39)
LDL Chol Calc (NIH): 112 mg/dL — ABNORMAL HIGH (ref 0–99)
LDL/HDL Ratio: 3.4 ratio — ABNORMAL HIGH (ref 0.0–3.2)
Triglycerides: 401 mg/dL — ABNORMAL HIGH (ref 0–149)
VLDL Cholesterol Cal: 70 mg/dL — ABNORMAL HIGH (ref 5–40)

## 2020-01-03 LAB — COMPREHENSIVE METABOLIC PANEL
ALT: 13 IU/L (ref 0–32)
AST: 13 IU/L (ref 0–40)
Albumin/Globulin Ratio: 1.6 (ref 1.2–2.2)
Albumin: 4.2 g/dL (ref 3.8–4.8)
Alkaline Phosphatase: 85 IU/L (ref 44–121)
BUN/Creatinine Ratio: 7 — ABNORMAL LOW (ref 9–23)
BUN: 5 mg/dL — ABNORMAL LOW (ref 6–24)
Bilirubin Total: 0.3 mg/dL (ref 0.0–1.2)
CO2: 21 mmol/L (ref 20–29)
Calcium: 9.3 mg/dL (ref 8.7–10.2)
Chloride: 104 mmol/L (ref 96–106)
Creatinine, Ser: 0.67 mg/dL (ref 0.57–1.00)
GFR calc Af Amer: 122 mL/min/{1.73_m2} (ref >59)
GFR calc non Af Amer: 106 mL/min/{1.73_m2} (ref >59)
Globulin, Total: 2.7 g/dL (ref 1.5–4.5)
Glucose: 117 mg/dL — ABNORMAL HIGH (ref 65–99)
Potassium: 3.6 mmol/L (ref 3.5–5.2)
Sodium: 142 mmol/L (ref 134–144)
Total Protein: 6.9 g/dL (ref 6.0–8.5)

## 2020-01-03 LAB — TSH: TSH: 2.95 u[IU]/mL (ref 0.450–4.500)

## 2020-01-16 ENCOUNTER — Other Ambulatory Visit: Payer: Self-pay

## 2020-01-16 ENCOUNTER — Ambulatory Visit: Payer: Self-pay | Admitting: Gerontology

## 2020-01-16 VITALS — BP 127/84 | HR 79 | Temp 97.9°F | Ht 62.0 in | Wt 184.0 lb

## 2020-01-16 DIAGNOSIS — E785 Hyperlipidemia, unspecified: Secondary | ICD-10-CM

## 2020-01-16 DIAGNOSIS — N6459 Other signs and symptoms in breast: Secondary | ICD-10-CM
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
## 2020-01-16 NOTE — Patient Instructions (Signed)
DASH Eating Plan DASH stands for "Dietary Approaches to Stop Hypertension." The DASH eating plan is a healthy eating plan that has been shown to reduce high blood pressure (hypertension). It may also reduce your risk for type 2 diabetes, heart disease, and stroke. The DASH eating plan may also help with weight loss. What are tips for following this plan?  General guidelines  Avoid eating more than 2,300 mg (milligrams) of salt (sodium) a day. If you have hypertension, you may need to reduce your sodium intake to 1,500 mg a day.  Limit alcohol intake to no more than 1 drink a day for nonpregnant women and 2 drinks a day for men. One drink equals 12 oz of beer, 5 oz of wine, or 1 oz of hard liquor.  Work with your health care provider to maintain a healthy body weight or to lose weight. Ask what an ideal weight is for you.  Get at least 30 minutes of exercise that causes your heart to beat faster (aerobic exercise) most days of the week. Activities may include walking, swimming, or biking.  Work with your health care provider or diet and nutrition specialist (dietitian) to adjust your eating plan to your individual calorie needs. Reading food labels   Check food labels for the amount of sodium per serving. Choose foods with less than 5 percent of the Daily Value of sodium. Generally, foods with less than 300 mg of sodium per serving fit into this eating plan.  To find whole grains, look for the word "whole" as the first word in the ingredient list. Shopping  Buy products labeled as "low-sodium" or "no salt added."  Buy fresh foods. Avoid canned foods and premade or frozen meals. Cooking  Avoid adding salt when cooking. Use salt-free seasonings or herbs instead of table salt or sea salt. Check with your health care provider or pharmacist before using salt substitutes.  Do not fry foods. Cook foods using healthy methods such as baking, boiling, grilling, and broiling instead.  Cook with  heart-healthy oils, such as olive, canola, soybean, or sunflower oil. Meal planning  Eat a balanced diet that includes: ? 5 or more servings of fruits and vegetables each day. At each meal, try to fill half of your plate with fruits and vegetables. ? Up to 6-8 servings of whole grains each day. ? Less than 6 oz of lean meat, poultry, or fish each day. A 3-oz serving of meat is about the same size as a deck of cards. One egg equals 1 oz. ? 2 servings of low-fat dairy each day. ? A serving of nuts, seeds, or beans 5 times each week. ? Heart-healthy fats. Healthy fats called Omega-3 fatty acids are found in foods such as flaxseeds and coldwater fish, like sardines, salmon, and mackerel.  Limit how much you eat of the following: ? Canned or prepackaged foods. ? Food that is high in trans fat, such as fried foods. ? Food that is high in saturated fat, such as fatty meat. ? Sweets, desserts, sugary drinks, and other foods with added sugar. ? Full-fat dairy products.  Do not salt foods before eating.  Try to eat at least 2 vegetarian meals each week.  Eat more home-cooked food and less restaurant, buffet, and fast food.  When eating at a restaurant, ask that your food be prepared with less salt or no salt, if possible. What foods are recommended? The items listed may not be a complete list. Talk with your dietitian about   what dietary choices are best for you. Grains Whole-grain or whole-wheat bread. Whole-grain or whole-wheat pasta. Brown rice. Oatmeal. Quinoa. Bulgur. Whole-grain and low-sodium cereals. Pita bread. Low-fat, low-sodium crackers. Whole-wheat flour tortillas. Vegetables Fresh or frozen vegetables (raw, steamed, roasted, or grilled). Low-sodium or reduced-sodium tomato and vegetable juice. Low-sodium or reduced-sodium tomato sauce and tomato paste. Low-sodium or reduced-sodium canned vegetables. Fruits All fresh, dried, or frozen fruit. Canned fruit in natural juice (without  added sugar). Meat and other protein foods Skinless chicken or turkey. Ground chicken or turkey. Pork with fat trimmed off. Fish and seafood. Egg whites. Dried beans, peas, or lentils. Unsalted nuts, nut butters, and seeds. Unsalted canned beans. Lean cuts of beef with fat trimmed off. Low-sodium, lean deli meat. Dairy Low-fat (1%) or fat-free (skim) milk. Fat-free, low-fat, or reduced-fat cheeses. Nonfat, low-sodium ricotta or cottage cheese. Low-fat or nonfat yogurt. Low-fat, low-sodium cheese. Fats and oils Soft margarine without trans fats. Vegetable oil. Low-fat, reduced-fat, or light mayonnaise and salad dressings (reduced-sodium). Canola, safflower, olive, soybean, and sunflower oils. Avocado. Seasoning and other foods Herbs. Spices. Seasoning mixes without salt. Unsalted popcorn and pretzels. Fat-free sweets. What foods are not recommended? The items listed may not be a complete list. Talk with your dietitian about what dietary choices are best for you. Grains Baked goods made with fat, such as croissants, muffins, or some breads. Dry pasta or rice meal packs. Vegetables Creamed or fried vegetables. Vegetables in a cheese sauce. Regular canned vegetables (not low-sodium or reduced-sodium). Regular canned tomato sauce and paste (not low-sodium or reduced-sodium). Regular tomato and vegetable juice (not low-sodium or reduced-sodium). Pickles. Olives. Fruits Canned fruit in a light or heavy syrup. Fried fruit. Fruit in cream or butter sauce. Meat and other protein foods Fatty cuts of meat. Ribs. Fried meat. Bacon. Sausage. Bologna and other processed lunch meats. Salami. Fatback. Hotdogs. Bratwurst. Salted nuts and seeds. Canned beans with added salt. Canned or smoked fish. Whole eggs or egg yolks. Chicken or turkey with skin. Dairy Whole or 2% milk, cream, and half-and-half. Whole or full-fat cream cheese. Whole-fat or sweetened yogurt. Full-fat cheese. Nondairy creamers. Whipped toppings.  Processed cheese and cheese spreads. Fats and oils Butter. Stick margarine. Lard. Shortening. Ghee. Bacon fat. Tropical oils, such as coconut, palm kernel, or palm oil. Seasoning and other foods Salted popcorn and pretzels. Onion salt, garlic salt, seasoned salt, table salt, and sea salt. Worcestershire sauce. Tartar sauce. Barbecue sauce. Teriyaki sauce. Soy sauce, including reduced-sodium. Steak sauce. Canned and packaged gravies. Fish sauce. Oyster sauce. Cocktail sauce. Horseradish that you find on the shelf. Ketchup. Mustard. Meat flavorings and tenderizers. Bouillon cubes. Hot sauce and Tabasco sauce. Premade or packaged marinades. Premade or packaged taco seasonings. Relishes. Regular salad dressings. Where to find more information:  National Heart, Lung, and Blood Institute: www.nhlbi.nih.gov  American Heart Association: www.heart.org Summary  The DASH eating plan is a healthy eating plan that has been shown to reduce high blood pressure (hypertension). It may also reduce your risk for type 2 diabetes, heart disease, and stroke.  With the DASH eating plan, you should limit salt (sodium) intake to 2,300 mg a day. If you have hypertension, you may need to reduce your sodium intake to 1,500 mg a day.  When on the DASH eating plan, aim to eat more fresh fruits and vegetables, whole grains, lean proteins, low-fat dairy, and heart-healthy fats.  Work with your health care provider or diet and nutrition specialist (dietitian) to adjust your eating plan to your   individual calorie needs. This information is not intended to replace advice given to you by your health care provider. Make sure you discuss any questions you have with your health care provider. Document Revised: 01/13/2017 Document Reviewed: 01/25/2016 Elsevier Patient Education  2020 [employer] Inc[employer]  

## 2020-01-22 ENCOUNTER — Encounter: Payer: Self-pay | Admitting: *Deleted

## 2020-01-22 ENCOUNTER — Ambulatory Visit
Admission: RE | Admit: 2020-01-22 | Discharge: 2020-01-22 | Disposition: A | Discharge status: Home / Self Care | Payer: Self-pay | Source: Ambulatory Visit | Attending: Oncology | Admitting: Oncology

## 2020-01-22 ENCOUNTER — Ambulatory Visit: Payer: Self-pay | Attending: Oncology | Admitting: *Deleted

## 2020-01-22 ENCOUNTER — Other Ambulatory Visit: Payer: Self-pay

## 2020-01-22 VITALS — BP 137/88 | HR 81 | Temp 98.2°F | Ht 63.0 in | Wt 137.0 lb

## 2020-01-22 DIAGNOSIS — N63 Unspecified lump in unspecified breast: Secondary | ICD-10-CM

## 2020-01-22 NOTE — Progress Notes (Signed)
  Subjective:     Patient ID: Nicole Lambert, female   DOB: [birth year], 44 y.o.   MRN: [id number]  HPI   Review of Systems     Objective:   Physical Exam Chest:     Breasts:        Right: No swelling, bleeding, inverted nipple, mass, nipple discharge, skin change or tenderness.        Left: Swelling and mass present. No bleeding, inverted nipple, nipple discharge, skin change or tenderness.    Lymphadenopathy:     Upper Body:     Right upper body: No supraclavicular or axillary adenopathy.     Left upper body: No supraclavicular or axillary adenopathy.        Assessment:     44 year old Arabic speaking female returns to [facility] with complaints of enlarging left breast mass with numbness, tingling and deviated nipple.  Salma, the interpreter, present during the interview and exam.  Patient had a left breast biopsy on 03/14/19 with noted Charlton on pathology.  6 month follow up mammo was a birads 3.  On clinical breast exam the left breast is at least twice to three times the size of the right breast.  The nipple is deviated to midline.  There is notable excess left lateral breast tissue.  There is also a scar along the lower left breast.  Patient states she is experiencing numbness and did not feel that she was getting burned while standing at the stove.  Patient states she can feel a mass at 12:00.  With the patient is semi-fowlers position I can palpate an approximate 2 cm mobile mass 3 cm from the nipple.  Taught self breast awareness.  Last pap on 03/21/18 was negative / negative.  Next pap due in 2025.  Patient has been screened for eligibility.  She does not have any insurance, Medicare or Medicaid.  She also meets financial eligibility.   Risk Assessment    Risk Scores      01/22/2020 03/05/2019   Last edited by: Rico Junker, RN Theodore Demark, RN   5-year risk: 1.5 % 1 %   Lifetime risk: 13.6 % 11.6 %             Plan:     Will get bilateral diagnostic mammogram and  ultrasound.  Discussed with patient that I will refer for surgical consult when I have all the imaging results, and possible biopsy results.  She is agreeable to the plan.  Will follow up per [facility] protocol

## 2020-01-22 NOTE — Patient Instructions (Signed)
Gave patient hand-out, Women Staying Healthy, Active and Well from [facility], with education on breast health, pap smears, heart and colon health. 

## 2020-01-22 NOTE — Progress Notes (Signed)
Received message from Dr. Grayland Ormond in regards to patient today.  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  Mammogram report is not available for review.  As I had discussed earlier with patient today, she has been scheduled to see Dr. Luther Bradley on 01/28/20 @ 1:45 for surgical consultation.  Spoke with patient and reviewed consult.  She states she will have insurance the first of the year.  Encouraged her to keep her appointment on the 14th and if surgery is a recommendation, to wait until the first of the year.  Explained [facility] will pay for her consultation.  She is agreeable to the plan.  Will follow up per [facility] protocol.

## 2020-01-23 ENCOUNTER — Other Ambulatory Visit: Payer: Self-pay | Admitting: *Deleted

## 2020-01-23 DIAGNOSIS — N63 Unspecified lump in unspecified breast: Secondary | ICD-10-CM

## 2020-01-28 ENCOUNTER — Telehealth: Payer: Self-pay

## 2020-01-28 ENCOUNTER — Other Ambulatory Visit: Payer: Self-pay

## 2020-01-28 ENCOUNTER — Encounter: Payer: Self-pay | Admitting: Surgery

## 2020-01-28 ENCOUNTER — Ambulatory Visit (INDEPENDENT_AMBULATORY_CARE_PROVIDER_SITE_OTHER): Payer: Self-pay | Admitting: Surgery

## 2020-01-28 VITALS — BP 131/89 | HR 80 | Temp 98.8°F | Ht 63.0 in | Wt 182.6 lb

## 2020-01-28 DIAGNOSIS — N6489 Other specified disorders of breast: Secondary | ICD-10-CM

## 2020-01-28 NOTE — Telephone Encounter (Signed)
Spoke with Vita Barley regarding this patient and breast biopsies- Dr.Rodenbergs contact information was provided and Vita Barley will call and speak to Dr.Rodenberg directly and will also let me know if there  is something I need to do.

## 2020-01-28 NOTE — Patient Instructions (Addendum)
Please be sure to have the scheduled Mammograms and biopsies.     Breast Self-Awareness Breast self-awareness is knowing how your breasts look and feel. Doing breast self-awareness is important. It allows you to catch a breast problem early while it is still small and can be treated. All women should do breast self-awareness, including women who have had breast implants. Tell your doctor if you notice a change in your breasts. What you need:  A mirror.  A well-lit room. How to do a breast self-exam A breast self-exam is one way to learn what is normal for your breasts and to check for changes. To do a breast self-exam: Look for changes  1. Take off all the clothes above your waist. 2. Stand in front of a mirror in a room with good lighting. 3. Put your hands on your hips. 4. Push your hands down. 5. Look at your breasts and nipples in the mirror to see if one breast or nipple looks different from the other. Check to see if: ? The shape of one breast is different. ? The size of one breast is different. ? There are wrinkles, dips, and bumps in one breast and not the other. 6. Look at each breast for changes in the skin, such as: ? Redness. ? Scaly areas. 7. Look for changes in your nipples, such as: ? Liquid around the nipples. ? Bleeding. ? Dimpling. ? Redness. ? A change in where the nipples are. Feel for changes  1. Lie on your back on the floor. 2. Feel each breast. To do this, follow these steps: ? Pick a breast to feel. ? Put the arm closest to that breast above your head. ? Use your other arm to feel the nipple area of your breast. Feel the area with the pads of your three middle fingers by making small circles with your fingers. For the first circle, press lightly. For the second circle, press harder. For the third circle, press even harder. ? Keep making circles with your fingers at the different pressures as you move down your breast. Stop when you feel your  ribs. ? Move your fingers a little toward the center of your body. ? Start making circles with your fingers again, this time going up until you reach your collarbone. ? Keep making up-and-down circles until you reach your armpit. Remember to keep using the three pressures. ? Feel the other breast in the same way. 3. Sit or stand in the tub or shower. 4. With soapy water on your skin, feel each breast the same way you did in step 2 when you were lying on the floor. Write down what you find Writing down what you find can help you remember what to tell your doctor. Write down:  What is normal for each breast.  Any changes you find in each breast, including: ? The kind of changes you find. ? Whether you have pain. ? Size and location of any lumps.  When you last had your menstrual period. General tips  Check your breasts every month.  If you are breastfeeding, the best time to check your breasts is after you feed your baby or after you use a breast pump.  If you get menstrual periods, the best time to check your breasts is 5-7 days after your menstrual period is over.  With time, you will become comfortable with the self-exam, and you will begin to know if there are changes in your breasts. Contact a doctor if  you:  See a change in the shape or size of your breasts or nipples.  See a change in the skin of your breast or nipples, such as red or scaly skin.  Have fluid coming from your nipples that is not normal.  Find a lump or thick area that was not there before.  Have pain in your breasts.  Have any concerns about your breast health. Summary  Breast self-awareness includes looking for changes in your breasts, as well as feeling for changes within your breasts.  Breast self-awareness should be done in front of a mirror in a well-lit room.  You should check your breasts every month. If you get menstrual periods, the best time to check your breasts is 5-7 days after your  menstrual period is over.  Let your doctor know of any changes you see in your breasts, including changes in size, changes on the skin, pain or tenderness, or fluid from your nipples that is not normal. This information is not intended to replace advice given to you by your health care provider. Make sure you discuss any questions you have with your health care provider. Document Revised: 09/19/2017 Document Reviewed: 09/19/2017 Elsevier Patient Education  [place]. We will contact Nicole Lambert to let her know Dr.Rodenberg would like for you to see the Plastic surgeon. Nicole Lambert will arrange that appointment.   Please be sure to have your scheduled biopsy on 03/12/19. Please see your follow up appointment listed below.
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
## 2020-02-03 ENCOUNTER — Other Ambulatory Visit: Payer: Self-pay

## 2020-02-03 ENCOUNTER — Ambulatory Visit
Admission: RE | Admit: 2020-02-03 | Discharge: 2020-02-03 | Disposition: A | Discharge status: Home / Self Care | Payer: Self-pay | Source: Ambulatory Visit | Attending: Oncology | Admitting: Oncology

## 2020-02-03 DIAGNOSIS — N63 Unspecified lump in unspecified breast: Secondary | ICD-10-CM

## 2020-02-03 HISTORY — PX: BREAST BIOPSY: SHX20

## 2020-02-04 LAB — SURGICAL PATHOLOGY

## 2020-02-20 ENCOUNTER — Encounter: Payer: Self-pay | Admitting: Surgery

## 2020-02-20 ENCOUNTER — Ambulatory Visit (INDEPENDENT_AMBULATORY_CARE_PROVIDER_SITE_OTHER): Payer: 59 | Admitting: Surgery

## 2020-02-20 ENCOUNTER — Other Ambulatory Visit: Payer: Self-pay

## 2020-02-20 DIAGNOSIS — N63 Unspecified lump in unspecified breast: Secondary | ICD-10-CM

## 2020-02-20 DIAGNOSIS — N6489 Other specified disorders of breast: Secondary | ICD-10-CM
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
## 2020-02-20 NOTE — Patient Instructions (Signed)
A referral to plastic surgeon has been placed for you. They will call you with an appointment.

## 2020-02-24 ENCOUNTER — Ambulatory Visit (INDEPENDENT_AMBULATORY_CARE_PROVIDER_SITE_OTHER): Payer: 59 | Admitting: Plastic Surgery

## 2020-02-24 ENCOUNTER — Encounter: Payer: Self-pay | Admitting: Plastic Surgery

## 2020-02-24 ENCOUNTER — Other Ambulatory Visit: Payer: Self-pay

## 2020-02-24 VITALS — BP 121/79 | HR 85 | Temp 98.4°F | Ht 62.0 in | Wt 173.0 lb

## 2020-02-24 DIAGNOSIS — N62 Hypertrophy of breast: Secondary | ICD-10-CM

## 2020-02-24 DIAGNOSIS — N6489 Other specified disorders of breast: Secondary | ICD-10-CM | POA: Diagnosis not present

## 2020-02-24 DIAGNOSIS — M546 Pain in thoracic spine: Secondary | ICD-10-CM

## 2020-02-24 DIAGNOSIS — M4004 Postural kyphosis, thoracic region: Secondary | ICD-10-CM | POA: Diagnosis not present

## 2020-02-24 DIAGNOSIS — M545 Low back pain, unspecified: Secondary | ICD-10-CM
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
## 2020-02-25 ENCOUNTER — Ambulatory Visit: Payer: Self-pay | Admitting: Gerontology

## 2020-02-27 ENCOUNTER — Encounter: Payer: Self-pay | Admitting: *Deleted

## 2020-02-27 NOTE — Progress Notes (Signed)
Patient now has insurance and is under the care of general surgery and plastic surgery for her [place].

## 2020-03-11 ENCOUNTER — Other Ambulatory Visit: Payer: Self-pay

## 2020-03-11 ENCOUNTER — Ambulatory Visit: Payer: Self-pay

## 2020-03-12 ENCOUNTER — Telehealth: Payer: Self-pay | Admitting: Plastic Surgery

## 2020-03-12 NOTE — Telephone Encounter (Signed)
Additional note to previous message. Nicole Lambert relayed that she is having consistent pain under her left breast where Nicole stomach and under part of Nicole breast tissue meet. This is Nicole breast with Nicole masses, so Nicole breast is much heavier than Nicole contralateral breast. She advised that she has been taking 600 mg of ibuprofen, but Nicole pain comes back after 8 hours. She is worried that if she takes ibuprofen every day that it will not help her after Nicole surgery.  I was able to call [employer] via cell phone, while Nicole interpreter and Lambert were on hold through my work phone. I relayed this information to [facility], who pulled her chart for review. He suggested to continue to take Nicole ibuprofen (800 grams) every 8 hours as needed for pain, and that she will not build up a tolerance for that medicine. If her pain is too intense and unable to be managed with OTC medications, she can proceed to Nicole ED or call her PCP for pain medications. She is scheduled for a pre-op on 2/10 and he will go over post-operative pain options at that visit.   I relayed all of Nicole above information to Nicole Lambert via Nicole interpreter. Nicole Lambert expressed understanding and agreement to Nicole plan given.

## 2020-03-12 NOTE — Telephone Encounter (Signed)
Correction to mistype on previous note. The "800 mg" notation was a mistype, patient was advised to continue to take 600 mg of ibuprofen every 8 hours for pain.

## 2020-03-12 NOTE — Telephone Encounter (Signed)
Call placed to the patient via [employer]. The letter for [facility] T[facility]'s sister has been provided to the patient. Ms. Beske needs to come pick up the letter and send it to her sister in [place] to take to the [place]. The patient's sister needs to call the [place] in [place] and schedule an appointment for a tourist visa interview. She can take the letter with her to the [facility] to confirm the surgery date. We cannot initiate this process, only provide confirmation of the surgery. The patient is aware of this information via the interpretation service, and will pick the letter up to send to her sister.

## 2020-03-26 ENCOUNTER — Ambulatory Visit: Payer: Self-pay | Admitting: Surgery

## 2020-03-26 ENCOUNTER — Ambulatory Visit (INDEPENDENT_AMBULATORY_CARE_PROVIDER_SITE_OTHER): Payer: 59 | Admitting: Surgical

## 2020-03-26 DIAGNOSIS — N62 Hypertrophy of breast: Secondary | ICD-10-CM

## 2020-03-26 DIAGNOSIS — N6489 Other specified disorders of breast: Secondary | ICD-10-CM

## 2020-03-26 DIAGNOSIS — M4004 Postural kyphosis, thoracic region: Secondary | ICD-10-CM

## 2020-03-26 DIAGNOSIS — M546 Pain in thoracic spine: Secondary | ICD-10-CM

## 2020-03-26 DIAGNOSIS — M545 Low back pain, unspecified: Secondary | ICD-10-CM

## 2020-03-26 NOTE — H&P (View-Only) (Signed)
No show

## 2020-03-26 NOTE — Progress Notes (Signed)
No show

## 2020-03-30 ENCOUNTER — Encounter: Payer: Self-pay | Admitting: Surgical

## 2020-03-30 ENCOUNTER — Ambulatory Visit (INDEPENDENT_AMBULATORY_CARE_PROVIDER_SITE_OTHER): Payer: 59 | Admitting: Surgical

## 2020-03-30 ENCOUNTER — Other Ambulatory Visit: Payer: Self-pay

## 2020-03-30 ENCOUNTER — Encounter (HOSPITAL_BASED_OUTPATIENT_CLINIC_OR_DEPARTMENT_OTHER): Payer: Self-pay | Admitting: Plastic Surgery

## 2020-03-30 ENCOUNTER — Other Ambulatory Visit: Payer: Self-pay | Admitting: Surgical

## 2020-03-30 DIAGNOSIS — N62 Hypertrophy of breast: Secondary | ICD-10-CM

## 2020-03-30 DIAGNOSIS — M4004 Postural kyphosis, thoracic region: Secondary | ICD-10-CM

## 2020-03-30 DIAGNOSIS — M545 Low back pain, unspecified: Secondary | ICD-10-CM

## 2020-03-30 DIAGNOSIS — M546 Pain in thoracic spine: Secondary | ICD-10-CM

## 2020-03-30 DIAGNOSIS — N6489 Other specified disorders of breast: Secondary | ICD-10-CM

## 2020-03-30 MED ORDER — ONDANSETRON HCL 4 MG PO TABS: 4.0000 mg | ORAL_TABLET | Freq: Three times a day (TID) | ORAL | 0 refills | Status: DC | PRN

## 2020-03-30 MED ORDER — HYDROCODONE-ACETAMINOPHEN 5-325 MG PO TABS: 1.0000 | ORAL_TABLET | Freq: Four times a day (QID) | ORAL | 0 refills | Status: AC | PRN

## 2020-03-30 NOTE — Progress Notes (Signed)
Patient ID: Nicole Lambert, female    DOB: [birth year], 45 y.o.   MRN: [id number]  Chief Complaint  Patient presents with  . Pre-op Exam      ICD-10-CM   1. Macromastia  N62   2. Back pain of thoracolumbar region  M54.50    M54.6   3. 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  female  with a history of macromastia and multiple breast masses.  She presents for preoperative evaluation for upcoming procedure, Bilateral Breast Reduction via amputation technique, scheduled for 04/07/2020 with Dr.  Claudia Desanctis.  She presents today with an interpreter for assistance with her preoperative appointment.  Patient has no personal history of anesthesia.  She does report no family history of complications with anesthesia.  No history of DVT/PE.  No family history of DVT/PE.  No family or personal history of bleeding or clotting disorders.  Patient is not currently taking any blood thinners.  No history of CVA/MI.   Summary of Previous Visit: Patient presents to discuss multiple breast masses.  They have grown in size significantly within the past year.  She has had an ultrasound and mammogram documentation of multiple growing masses in both breasts, most have been biopsied demonstrating pseudoangiomatous stromal hyperplasia.  She reports intermittent tenderness in some of these masses.  She reports her breast is significantly grown in size in concordance with the masses and she is interested in having the mole removed.  There is no family history of breast cancer.  She reports diminished sensation in the lower outer aspect of both breasts.  She is up-to-date on her mammograms.  Estimate 500 g of tissue to be removed from each side.  PMH Significant for: Essential hypertension Patient does have a hormone coated IUD in place.   Past Medical History: Allergies: No Known Allergies  Current  Medications: No current outpatient medications on file.  Past Medical Problems: Past Medical History:  Diagnosis Date  . Hypertension     Past Surgical History: Past Surgical History:  Procedure Laterality Date  . BREAST BIOPSY Left 03/14/2019   [place] bx 12:00 coil marker, PSEUDOANGIOMATOUS STROMAL HYPERPLASIA   . BREAST BIOPSY Left 03/14/2019   [place] bx 3:00 PSEUDOANGIOMATOUS STROMAL HYPERPLASIA   . BREAST BIOPSY Right 03/14/2019   [place] bx ribbon marker PASH and GYNECOMASTIA-LIKE HYPERPLASIA  . BREAST BIOPSY Right 02/03/2020   6:00-vision, path pending  . BREAST BIOPSY Right 02/03/2020   4:00-x shape, path pending  . BREAST BIOPSY Left 02/03/2020   axilla hydro marker, path pending    Social History: Social History   Socioeconomic History  . Marital status: Married    Spouse name: Not on file  . Number of children: 4  . Years of education: Not on file  . Highest education level: Bachelor's degree (e.g., BA, AB, BS)  Occupational History  . Occupation: unemployed  Tobacco Use  . Smoking status: Never Smoker  . Smokeless tobacco: Never Used  Vaping Use  . Vaping Use: Never used  Substance and Sexual Activity  . Alcohol use: Never  . Drug use: Never  . Sexual activity: Yes    Birth control/protection: I.U.D.  Other Topics Concern  . Not on file  Social History Narrative  . Not on file   Social Determinants of Health   Financial Resource Strain: Low Risk   .  Difficulty of Paying Living Expenses: Not hard at all  Food Insecurity: Not on file  Transportation Needs: Not on file  Physical Activity: Unknown  . Days of Exercise per Week: 0 days  . Minutes of Exercise per Session: Not on file  Stress: Not on file  Social Connections: Not on file  Intimate Partner Violence: Not At Risk  . Fear of Current or Ex-Partner: No  . Emotionally Abused: No  . Physically Abused: No  . Sexually Abused: No    Family History: Family History  Problem Relation Age of Onset  .  Kidney disease Cousin   . Adrenal disorder Neg Hx     Review of Systems: Review of Systems  Constitutional: Negative.   Respiratory: Negative.   Cardiovascular: Negative.   Gastrointestinal: Negative.   Neurological: Negative.     Physical Exam: Vital Signs There were no vitals taken for this visit. Physical Exam Constitutional:      General: Not in acute distress.    Appearance: Normal appearance. Not ill-appearing.  HENT:     Head: Normocephalic and atraumatic.  Eyes:     Pupils: Pupils are equal, round Neck:     Musculoskeletal: Normal range of motion.  Cardiovascular:     Rate and Rhythm: Normal rate and regular rhythm.     Pulses: Normal pulses.     Heart sounds: Normal heart sounds. No murmur.  Pulmonary:     Effort: Pulmonary effort is normal. No respiratory distress.     Breath sounds: Normal breath sounds. No wheezing.  Abdominal:     General: Abdomen is flat. There is no distension.     Palpations: Abdomen is soft.     Tenderness: There is no abdominal tenderness.  Musculoskeletal: Normal range of motion.  Skin:    General: Skin is warm and dry.     Findings: No erythema or rash.  Neurological:     General: No focal deficit present.     Mental Status: Alert and oriented to person, place, and time. Mental status is at baseline.     Motor: No weakness.  Psychiatric:        Mood and Affect: Mood normal.        Behavior: Behavior normal.    Assessment/Plan: The patient is scheduled for bilateral breast reduction with free nipple graft/ amputation technique with Dr. Claudia Desanctis.  Risks, benefits, and alternatives of procedure discussed, questions answered and consent obtained.    Smoking Status: Non-smoker, however does report that her husband smokes in the car and she is sometimes exposed to secondhand smoke; Counseling Given?  Yes Last Mammogram: 02/03/2020 with subsequent ultrasound of right and left breast.; Results: Biopsy showed pseudoangiomatous stromal  hyperplasia  Caprini Score: 5, high; Risk Factors include: Age, BMI greater than 25, varicose veins and length of planned surgery. Recommendation for mechanical and pharmacological prophylaxis. Encourage early ambulation.   Pictures obtained:@Consult   Post-op Rx sent to pharmacy: Norco, Zofran Patient was provided with paper prescription for hydrocodone to bring to her medication management pharmacy in [place]. Discussed with patient she should pick this up prior to surgery and have at home and not use until after surgery.  Patient is understanding and in agreement with this.  Patient was provided with the breast reduction and General Surgical Risk consent document and Pain Medication Agreement prior to their appointment.  Interpreter was present with patient throughout the entire appointment and they had adequate time to read through the risk consent documents.  Patient was here  for a total of 2 hours for her preoperative evaluation. We also discussed them in person together during this preop appointment. All of their questions were answered to their satisfaction.  Recommended calling if they have any further questions.  Risk consent form and Pain Medication Agreement to be scanned into patient's chart.  The risk that can be encountered with breast reduction were discussed and include the following but not limited to these:  Breast asymmetry, fluid accumulation, firmness of the breast, inability to breast feed, loss of nipple or areola, skin loss, decrease or no nipple sensation, fat necrosis of the breast tissue, bleeding, infection, healing delay.  There are risks of anesthesia, changes to skin sensation and injury to nerves or blood vessels.  The muscle can be temporarily or permanently injured.  You may have an allergic reaction to tape, suture, glue, blood products which can result in skin discoloration, swelling, pain, skin lesions, poor healing.  Any of these can lead to the need for revisonal  surgery or stage procedures.  A reduction has potential to interfere with diagnostic procedures.  Nipple or breast piercing can increase risks of infection.  This procedure is best done when the breast is fully developed.  Changes in the breast will continue to occur over time.  Pregnancy can alter the outcomes of previous breast reduction surgery, weight gain and weigh loss can also effect the long term appearance.   We discussed the risks associated with bilateral breast reduction via skin graft/amputation technique.  I discussed with the patient that she would likely not have any sensation in her bilateral NAC.  She will also likely have some changes in color of bilateral NAC's given the course of skin graft healing.  We also discussed possibilities for poor take of the graft.  Patient is also aware that she would not be able to breast-feed.     Electronically signed by: Carola Rhine Gael Delude, PA-C 03/30/2020 11:46 AM

## 2020-04-03 ENCOUNTER — Encounter: Payer: Self-pay | Admitting: Obstetrics

## 2020-04-03 ENCOUNTER — Ambulatory Visit (INDEPENDENT_AMBULATORY_CARE_PROVIDER_SITE_OTHER): Payer: 59 | Admitting: Obstetrics

## 2020-04-03 ENCOUNTER — Telehealth: Payer: Self-pay

## 2020-04-03 ENCOUNTER — Inpatient Hospital Stay (HOSPITAL_COMMUNITY): Admission: RE | Admit: 2020-04-03 | Payer: 59 | Source: Ambulatory Visit

## 2020-04-03 ENCOUNTER — Other Ambulatory Visit: Payer: Self-pay | Admitting: Surgical

## 2020-04-03 ENCOUNTER — Other Ambulatory Visit
Admission: RE | Admit: 2020-04-03 | Discharge: 2020-04-03 | Disposition: A | Discharge status: Home / Self Care | Payer: 59 | Source: Ambulatory Visit | Attending: Plastic Surgery | Admitting: Plastic Surgery

## 2020-04-03 ENCOUNTER — Other Ambulatory Visit: Payer: Self-pay

## 2020-04-03 VITALS — BP 130/90 | Ht 62.0 in | Wt 184.0 lb

## 2020-04-03 DIAGNOSIS — Z30432 Encounter for removal of intrauterine contraceptive device: Secondary | ICD-10-CM

## 2020-04-03 DIAGNOSIS — Z01812 Encounter for preprocedural laboratory examination: Secondary | ICD-10-CM | POA: Insufficient documentation

## 2020-04-03 DIAGNOSIS — Z20822 Contact with and (suspected) exposure to covid-19: Secondary | ICD-10-CM | POA: Insufficient documentation

## 2020-04-03 MED ORDER — ONDANSETRON HCL 4 MG PO TABS: 4.0000 mg | ORAL_TABLET | Freq: Three times a day (TID) | ORAL | 0 refills | Status: DC | PRN

## 2020-04-03 NOTE — Telephone Encounter (Signed)
Patient called to say that she has surgery coming up on Tuesday.  She said that she was supposed to have two medications called in, one for pain and one for acid reflux.  Patient said that she has picked up her pain [facility] [place] but they did not have her prescription for acid reflux.  She said that she was told to go to [facility].  [facility] said that the prescription was incorrect.  Please call.  *Patient said that her preferred pharmacy is [facility] [place] in [place].

## 2020-04-03 NOTE — Progress Notes (Signed)
   GYNECOLOGY OFFICE PROCEDURE NOTE  Nicole Lambert is a 45 y.o. [id number] here for Mirena IUD removal placed 2021. She desires removal secondary to her age, and a concern about getting hormones, as she is being treated by endocrinology for hormonal 'irregularity"..  IUD Removal  Patient identified, informed consent performed, consent signed.  Patient was in the dorsal lithotomy position, normal external genitalia was noted.  A speculum was placed in the patient's vagina, normal discharge was noted, no lesions. The cervix was visualized, no lesions, no abnormal discharge.  The strings of the IUD were grasped and pulled using ring forceps. The IUD was removed in its entirety.   Patient tolerated the procedure well.    Patient will use nothing for contraception.  Routine preventative health maintenance measures emphasized.  Encouraged to return in July for her annual [facility] physical.  Nicole Lambert, Nicole Lambert  04/03/2020 9:08 AM   [facility], [facility]  CPT [phone]

## 2020-04-03 NOTE — Telephone Encounter (Signed)
The zofran is for nausea and vomiting, this was sent to her medication management clinic at her pre-op appt. I have resent the prescription to them today.

## 2020-04-04 LAB — SARS CORONAVIRUS 2 (TAT 6-24 HRS): SARS Coronavirus 2: NEGATIVE

## 2020-04-07 ENCOUNTER — Encounter (HOSPITAL_BASED_OUTPATIENT_CLINIC_OR_DEPARTMENT_OTHER): Payer: Self-pay | Admitting: Plastic Surgery

## 2020-04-07 ENCOUNTER — Ambulatory Visit (HOSPITAL_BASED_OUTPATIENT_CLINIC_OR_DEPARTMENT_OTHER): Payer: 59 | Admitting: Anesthesiology

## 2020-04-07 ENCOUNTER — Encounter (HOSPITAL_BASED_OUTPATIENT_CLINIC_OR_DEPARTMENT_OTHER)
Admission: RE | Disposition: A | Discharge status: Home / Self Care | Payer: Self-pay | Source: Home / Self Care | Attending: Plastic Surgery

## 2020-04-07 ENCOUNTER — Ambulatory Visit (HOSPITAL_BASED_OUTPATIENT_CLINIC_OR_DEPARTMENT_OTHER)
Admission: RE | Admit: 2020-04-07 | Discharge: 2020-04-07 | Disposition: A | Discharge status: Home / Self Care | Payer: 59 | Attending: Plastic Surgery | Admitting: Plastic Surgery

## 2020-04-07 ENCOUNTER — Other Ambulatory Visit: Payer: Self-pay

## 2020-04-07 ENCOUNTER — Telehealth: Payer: Self-pay | Admitting: Surgical

## 2020-04-07 DIAGNOSIS — N62 Hypertrophy of breast: Secondary | ICD-10-CM | POA: Insufficient documentation

## 2020-04-07 DIAGNOSIS — N63 Unspecified lump in unspecified breast: Secondary | ICD-10-CM

## 2020-04-07 DIAGNOSIS — N6489 Other specified disorders of breast: Secondary | ICD-10-CM | POA: Insufficient documentation

## 2020-04-07 DIAGNOSIS — N6082 Other benign mammary dysplasias of left breast: Secondary | ICD-10-CM | POA: Insufficient documentation

## 2020-04-07 DIAGNOSIS — N6081 Other benign mammary dysplasias of right breast: Secondary | ICD-10-CM | POA: Diagnosis not present

## 2020-04-07 HISTORY — PX: BREAST REDUCTION SURGERY: SHX8

## 2020-04-07 LAB — POCT PREGNANCY, URINE: Preg Test, Ur: NEGATIVE

## 2020-04-07 SURGERY — MAMMOPLASTY, REDUCTION
Anesthesia: General | Site: Breast | Laterality: Bilateral

## 2020-04-07 MED ORDER — ARTIFICIAL TEARS OPHTHALMIC OINT
TOPICAL_OINTMENT | OPHTHALMIC | Status: DC | PRN
  Administered 2020-04-07: 1 via OPHTHALMIC

## 2020-04-07 MED ORDER — ARTIFICIAL TEARS OPHTHALMIC OINT
TOPICAL_OINTMENT | OPHTHALMIC | Status: AC
  Filled 2020-04-07: qty 3.5

## 2020-04-07 MED ORDER — PROPOFOL 500 MG/50ML IV EMUL
INTRAVENOUS | Status: DC | PRN
  Administered 2020-04-07: 25 ug/kg/min via INTRAVENOUS

## 2020-04-07 MED ORDER — PHENYLEPHRINE 40 MCG/ML (10ML) SYRINGE FOR IV PUSH (FOR BLOOD PRESSURE SUPPORT)
PREFILLED_SYRINGE | INTRAVENOUS | Status: AC
  Filled 2020-04-07: qty 10

## 2020-04-07 MED ORDER — EPINEPHRINE PF 1 MG/ML IJ SOLN
INTRAMUSCULAR | Status: AC
  Filled 2020-04-07: qty 2

## 2020-04-07 MED ORDER — FENTANYL CITRATE (PF) 100 MCG/2ML IJ SOLN
50.0000 ug | Freq: Once | INTRAMUSCULAR | Status: AC
Start: 2020-04-07 — End: 2020-04-07
  Administered 2020-04-07: 50 ug via INTRAVENOUS

## 2020-04-07 MED ORDER — LACTATED RINGERS IV SOLN
INTRAVENOUS | Status: AC | PRN
  Administered 2020-04-07: 2000 mL

## 2020-04-07 MED ORDER — FENTANYL CITRATE (PF) 100 MCG/2ML IJ SOLN
INTRAMUSCULAR | Status: DC | PRN
  Administered 2020-04-07 (×2): 25 ug via INTRAVENOUS
  Administered 2020-04-07 (×3): 50 ug via INTRAVENOUS

## 2020-04-07 MED ORDER — PHENYLEPHRINE HCL (PRESSORS) 10 MG/ML IV SOLN
INTRAVENOUS | Status: DC | PRN
  Administered 2020-04-07: 120 ug via INTRAVENOUS
  Administered 2020-04-07 (×2): 80 ug via INTRAVENOUS
  Administered 2020-04-07: 120 ug via INTRAVENOUS

## 2020-04-07 MED ORDER — PROMETHAZINE HCL 25 MG/ML IJ SOLN: 6.2500 mg | INTRAMUSCULAR | Status: DC | PRN

## 2020-04-07 MED ORDER — PROPOFOL 10 MG/ML IV BOLUS
INTRAVENOUS | Status: DC | PRN
  Administered 2020-04-07: 200 mg via INTRAVENOUS

## 2020-04-07 MED ORDER — ACETAMINOPHEN 10 MG/ML IV SOLN
1000.0000 mg | Freq: Once | INTRAVENOUS | Status: AC
  Administered 2020-04-07: 1000 mg via INTRAVENOUS

## 2020-04-07 MED ORDER — 0.9 % SODIUM CHLORIDE (POUR BTL) OPTIME
TOPICAL | Status: DC | PRN
  Administered 2020-04-07: 1000 mL

## 2020-04-07 MED ORDER — LACTATED RINGERS IV SOLN: INTRAVENOUS | Status: DC

## 2020-04-07 MED ORDER — LIDOCAINE HCL (CARDIAC) PF 100 MG/5ML IV SOSY
PREFILLED_SYRINGE | INTRAVENOUS | Status: DC | PRN
  Administered 2020-04-07: 60 mg via INTRAVENOUS

## 2020-04-07 MED ORDER — PROPOFOL 500 MG/50ML IV EMUL
INTRAVENOUS | Status: AC
  Filled 2020-04-07: qty 50

## 2020-04-07 MED ORDER — OXYCODONE HCL 5 MG PO TABS
5.0000 mg | ORAL_TABLET | Freq: Once | ORAL | Status: AC | PRN
  Administered 2020-04-07: 5 mg via ORAL

## 2020-04-07 MED ORDER — KETOROLAC TROMETHAMINE 30 MG/ML IJ SOLN
30.0000 mg | Freq: Once | INTRAMUSCULAR | Status: AC | PRN
  Administered 2020-04-07: 30 mg via INTRAVENOUS

## 2020-04-07 MED ORDER — MIDAZOLAM HCL 2 MG/2ML IJ SOLN
INTRAMUSCULAR | Status: AC
  Filled 2020-04-07: qty 2

## 2020-04-07 MED ORDER — ACETAMINOPHEN 500 MG PO TABS: 1000.0000 mg | ORAL_TABLET | Freq: Once | ORAL | Status: DC

## 2020-04-07 MED ORDER — GLYCOPYRROLATE 0.2 MG/ML IJ SOLN
INTRAMUSCULAR | Status: DC | PRN
  Administered 2020-04-07: .1 mg via INTRAVENOUS

## 2020-04-07 MED ORDER — EPHEDRINE 5 MG/ML INJ
INTRAVENOUS | Status: AC
  Filled 2020-04-07: qty 10

## 2020-04-07 MED ORDER — KETOROLAC TROMETHAMINE 30 MG/ML IJ SOLN
INTRAMUSCULAR | Status: AC
  Filled 2020-04-07: qty 1

## 2020-04-07 MED ORDER — DEXAMETHASONE SODIUM PHOSPHATE 10 MG/ML IJ SOLN
INTRAMUSCULAR | Status: AC
  Filled 2020-04-07: qty 1

## 2020-04-07 MED ORDER — EPINEPHRINE PF 1 MG/ML IJ SOLN
INTRAMUSCULAR | Status: DC | PRN
  Administered 2020-04-07: 2 mg

## 2020-04-07 MED ORDER — FENTANYL CITRATE (PF) 100 MCG/2ML IJ SOLN
INTRAMUSCULAR | Status: AC
  Filled 2020-04-07: qty 2

## 2020-04-07 MED ORDER — MEPERIDINE HCL 25 MG/ML IJ SOLN: 6.2500 mg | INTRAMUSCULAR | Status: DC | PRN

## 2020-04-07 MED ORDER — BUPIVACAINE HCL (PF) 0.25 % IJ SOLN
INTRAMUSCULAR | Status: DC | PRN
  Administered 2020-04-07: 60 mL

## 2020-04-07 MED ORDER — DEXAMETHASONE SODIUM PHOSPHATE 10 MG/ML IJ SOLN
INTRAMUSCULAR | Status: DC | PRN
  Administered 2020-04-07: 10 mg via INTRAVENOUS

## 2020-04-07 MED ORDER — BUPIVACAINE HCL (PF) 0.25 % IJ SOLN
INTRAMUSCULAR | Status: AC
  Filled 2020-04-07: qty 60

## 2020-04-07 MED ORDER — EPHEDRINE SULFATE 50 MG/ML IJ SOLN
INTRAMUSCULAR | Status: DC | PRN
  Administered 2020-04-07 (×3): 10 mg via INTRAVENOUS

## 2020-04-07 MED ORDER — CEFAZOLIN SODIUM-DEXTROSE 2-4 GM/100ML-% IV SOLN
INTRAVENOUS | Status: AC
  Filled 2020-04-07: qty 100

## 2020-04-07 MED ORDER — HYDROMORPHONE HCL 1 MG/ML IJ SOLN
INTRAMUSCULAR | Status: AC
  Filled 2020-04-07: qty 0.5

## 2020-04-07 MED ORDER — CEFAZOLIN SODIUM-DEXTROSE 2-4 GM/100ML-% IV SOLN
2.0000 g | INTRAVENOUS | Status: AC
  Administered 2020-04-07: 2 g via INTRAVENOUS

## 2020-04-07 MED ORDER — MIDAZOLAM HCL 2 MG/2ML IJ SOLN
INTRAMUSCULAR | Status: DC | PRN
  Administered 2020-04-07: 2 mg via INTRAVENOUS

## 2020-04-07 MED ORDER — OXYCODONE HCL 5 MG/5ML PO SOLN: 5.0000 mg | Freq: Once | ORAL | Status: AC | PRN

## 2020-04-07 MED ORDER — ACETAMINOPHEN 10 MG/ML IV SOLN
INTRAVENOUS | Status: AC
  Filled 2020-04-07: qty 100

## 2020-04-07 MED ORDER — GLYCOPYRROLATE PF 0.2 MG/ML IJ SOSY
PREFILLED_SYRINGE | INTRAMUSCULAR | Status: AC
  Filled 2020-04-07: qty 1

## 2020-04-07 MED ORDER — ONDANSETRON HCL 4 MG/2ML IJ SOLN
INTRAMUSCULAR | Status: DC | PRN
  Administered 2020-04-07: 4 mg via INTRAVENOUS

## 2020-04-07 MED ORDER — HYDROMORPHONE HCL 1 MG/ML IJ SOLN
0.2500 mg | INTRAMUSCULAR | Status: DC | PRN
  Administered 2020-04-07 (×2): 0.5 mg via INTRAVENOUS

## 2020-04-07 MED ORDER — ONDANSETRON HCL 4 MG/2ML IJ SOLN
INTRAMUSCULAR | Status: AC
  Filled 2020-04-07: qty 2

## 2020-04-07 MED ORDER — LIDOCAINE 2% (20 MG/ML) 5 ML SYRINGE
INTRAMUSCULAR | Status: AC
  Filled 2020-04-07: qty 5

## 2020-04-07 MED ORDER — LACTATED RINGERS IV SOLN
INTRAVENOUS | Status: DC | PRN
  Administered 2020-04-07: 1000 mL

## 2020-04-07 MED ORDER — OXYCODONE HCL 5 MG PO TABS
ORAL_TABLET | ORAL | Status: AC
  Filled 2020-04-07: qty 1

## 2020-04-07 SURGICAL SUPPLY — 69 items
APL PRP STRL LF DISP 70% ISPRP (MISCELLANEOUS) ×2
APL SKNCLS STERI-STRIP NONHPOA (GAUZE/BANDAGES/DRESSINGS) ×1
BAG DECANTER FOR FLEXI CONT (MISCELLANEOUS) IMPLANT
BENZOIN TINCTURE PRP APPL 2/3 (GAUZE/BANDAGES/DRESSINGS) ×2 IMPLANT
BLADE SURG 10 STRL SS (BLADE) ×6 IMPLANT
BLADE SURG 15 STRL LF DISP TIS (BLADE) ×2 IMPLANT
BLADE SURG 15 STRL SS (BLADE) ×4
BNDG ELASTIC 6X5.8 VLCR STR LF (GAUZE/BANDAGES/DRESSINGS) ×2 IMPLANT
CANISTER SUCT 1200ML W/VALVE (MISCELLANEOUS) ×2 IMPLANT
CHLORAPREP W/TINT 26 (MISCELLANEOUS) ×4 IMPLANT
CLIP VESOCCLUDE MED 6/CT (CLIP) IMPLANT
COVER BACK TABLE 60X90IN (DRAPES) ×2 IMPLANT
COVER MAYO STAND STRL (DRAPES) ×2 IMPLANT
COVER WAND RF STERILE (DRAPES) IMPLANT
DECANTER SPIKE VIAL GLASS SM (MISCELLANEOUS) IMPLANT
DRAIN CHANNEL 15F RND FF W/TCR (WOUND CARE) IMPLANT
DRAIN PENROSE 1/2X12 LTX STRL (WOUND CARE) IMPLANT
DRAPE LAPAROSCOPIC ABDOMINAL (DRAPES) ×2 IMPLANT
DRAPE UTILITY XL STRL (DRAPES) ×2 IMPLANT
DRSG PAD ABDOMINAL 8X10 ST (GAUZE/BANDAGES/DRESSINGS) ×2 IMPLANT
ELECT REM PT RETURN 9FT ADLT (ELECTROSURGICAL) ×2
ELECTRODE REM PT RTRN 9FT ADLT (ELECTROSURGICAL) ×1 IMPLANT
EVACUATOR SILICONE 100CC (DRAIN) IMPLANT
GAUZE SPONGE 4X4 12PLY STRL (GAUZE/BANDAGES/DRESSINGS) ×2 IMPLANT
GAUZE XEROFORM 5X9 LF (GAUZE/BANDAGES/DRESSINGS) ×2 IMPLANT
GLOVE SRG 8 PF TXTR STRL LF DI (GLOVE) IMPLANT
GLOVE SURG ENC MOIS LTX SZ6.5 (GLOVE) IMPLANT
GLOVE SURG ENC MOIS LTX SZ7.5 (GLOVE) IMPLANT
GLOVE SURG ENC TEXT LTX SZ7.5 (GLOVE) ×2 IMPLANT
GLOVE SURG LTX SZ6.5 (GLOVE) IMPLANT
GLOVE SURG UNDER POLY LF SZ8 (GLOVE)
GOWN STRL REUS W/ TWL LRG LVL3 (GOWN DISPOSABLE) ×3 IMPLANT
GOWN STRL REUS W/TWL LRG LVL3 (GOWN DISPOSABLE) ×6
MARKER SKIN DUAL TIP RULER LAB (MISCELLANEOUS) IMPLANT
NDL SAFETY ECLIPSE 18X1.5 (NEEDLE) ×1 IMPLANT
NEEDLE FILTER BLUNT 18X 1/2SAF (NEEDLE) ×1
NEEDLE FILTER BLUNT 18X1 1/2 (NEEDLE) ×1 IMPLANT
NEEDLE HYPO 18GX1.5 SHARP (NEEDLE) ×2
NEEDLE HYPO 25X1 1.5 SAFETY (NEEDLE) IMPLANT
NEEDLE SPNL 18GX3.5 QUINCKE PK (NEEDLE) ×2 IMPLANT
NS IRRIG 1000ML POUR BTL (IV SOLUTION) ×2 IMPLANT
PACK BASIN DAY SURGERY FS (CUSTOM PROCEDURE TRAY) ×2 IMPLANT
PENCIL SMOKE EVACUATOR (MISCELLANEOUS) ×2 IMPLANT
PIN SAFETY STERILE (MISCELLANEOUS) IMPLANT
SHEET MEDIUM DRAPE 40X70 STRL (DRAPES) ×2 IMPLANT
SLEEVE SCD COMPRESS KNEE MED (MISCELLANEOUS) ×2 IMPLANT
SPONGE LAP 18X18 RF (DISPOSABLE) ×10 IMPLANT
STAPLER INSORB 30 2030 C-SECTI (MISCELLANEOUS) ×4 IMPLANT
STAPLER VISISTAT 35W (STAPLE) ×2 IMPLANT
STRIP SUTURE WOUND CLOSURE 1/2 (MISCELLANEOUS) ×6 IMPLANT
SUT CHROMIC 4 0 PS 2 18 (SUTURE) IMPLANT
SUT ETHILON 2 0 FS 18 (SUTURE) IMPLANT
SUT ETHILON 3 0 PS 1 (SUTURE) ×6 IMPLANT
SUT MNCRL AB 4-0 PS2 18 (SUTURE) ×4 IMPLANT
SUT PDS 3-0 CT2 (SUTURE) ×4
SUT PDS II 3-0 CT2 27 ABS (SUTURE) ×2 IMPLANT
SUT VIC AB 3-0 PS1 18 (SUTURE)
SUT VIC AB 3-0 PS1 18XBRD (SUTURE) IMPLANT
SUT VLOC 180 P-14 24 (SUTURE) ×4 IMPLANT
SYR 50ML LL SCALE MARK (SYRINGE) IMPLANT
SYR BULB IRRIG 60ML STRL (SYRINGE) ×2 IMPLANT
SYR CONTROL 10ML LL (SYRINGE) ×2 IMPLANT
TAPE MEASURE VINYL STERILE (MISCELLANEOUS) IMPLANT
TAPE STRIPS DRAPE STRL (GAUZE/BANDAGES/DRESSINGS) ×2 IMPLANT
TOWEL GREEN STERILE FF (TOWEL DISPOSABLE) ×4 IMPLANT
TUBE CONNECTING 20X1/4 (TUBING) ×2 IMPLANT
TUBING INFILTRATION IT-10001 (TUBING) ×2 IMPLANT
UNDERPAD 30X36 HEAVY ABSORB (UNDERPADS AND DIAPERS) ×4 IMPLANT
YANKAUER SUCT BULB TIP NO VENT (SUCTIONS) ×2 IMPLANT

## 2020-04-07 NOTE — Telephone Encounter (Signed)
Patient's husband called to advise that no pain medication has been called in or picked up. Per patient's husband they only have the medicine for nausea/vomiting. Preferred pharmacy is [facility] [place] in [place].

## 2020-04-07 NOTE — Discharge Instructions (Addendum)
Activity as tolerated: NO showers until after you see [place] in the clinic. You can do sponge bath, do not get your breasts wet. No heavy activities Diet: Regular  Wound Care: Keep dressing clean & dry. Keep wrap on as often as possible. Adjust as needed. You can change dressings if they get soiled. Do not change dressings for 3 days unless soiled.   Call doctor if any unusual problems occur such as pain, excessive bleeding, unrelieved nausea/vomiting, fever &/or chills  We will remove the bolster/yellow gauze around your nipple/areola at your 1 week follow up appointment.  Take Pain medication (Norco) as needed for severe pain. Otherwise, you can use ibuprofen or tylenol as needed. Avoid more than 3,000 mg of tylenol in 24 hours. Norco has 325mg  of tylenol per dose.   NO TYLENOL PRODUCTS UNTIL  5 pm NO ADVIL< MOTRIN UNTIL 7:30 if ok with surgeon Oxycodone 5mg  given at 11:40 am  Post Anesthesia Home Care Instructions  Activity: Get plenty of rest for the remainder of the day. A responsible individual must stay with you for 24 hours following the procedure.  For the next 24 hours, DO NOT: -Drive a car -Paediatric nurse -Drink alcoholic beverages -Take any medication unless instructed by your physician -Make any legal decisions or sign important papers.  Meals: Start with liquid foods such as gelatin or soup. Progress to regular foods as tolerated. Avoid greasy, spicy, heavy foods. If nausea and/or vomiting occur, drink only clear liquids until the nausea and/or vomiting subsides. Call your physician if vomiting continues.  Special Instructions/Symptoms: Your throat may feel dry or sore from the anesthesia or the breathing tube placed in your throat during surgery. If this causes discomfort, gargle with warm salt water. The discomfort should disappear within 24 hours.  If you had a scopolamine patch placed behind your ear for the management of post- operative nausea and/or  vomiting:  1. The medication in the patch is effective for 72 hours, after which it should be removed.  Wrap patch in a tissue and discard in the trash. Wash hands thoroughly with soap and water. 2. You may remove the patch earlier than 72 hours if you experience unpleasant side effects which may include dry mouth, dizziness or visual disturbances. 3. Avoid touching the patch. Wash your hands with soap and water after contact with the patch.

## 2020-04-07 NOTE — Transfer of Care (Signed)
Immediate Anesthesia Transfer of Care Note  Patient: Nicole Lambert  Procedure(s) Performed: Bilateral breast reduction with free nipple graft (Bilateral Breast)  Patient Location: PACU  Anesthesia Type:General  Level of Consciousness: awake, alert  and patient cooperative  Airway & Oxygen Therapy: Patient Spontanous Breathing and Patient connected to face mask oxygen  Post-op Assessment: Report given to RN, Post -op Vital signs reviewed and stable, Patient moving all extremities X 4 and Patient able to stick tongue midline  Post vital signs: Reviewed and stable  Last Vitals:  Vitals Value Taken Time  BP 136/101 04/07/20 0949  Temp    Pulse 96 04/07/20 0951  Resp 18 04/07/20 0951  SpO2 100 % 04/07/20 0951  Vitals shown include unvalidated device data.  Last Pain:  Vitals:   04/07/20 0654  TempSrc: Oral  PainSc: 0-No pain      Patients Stated Pain Goal: 4 (pain scale explained/ pt did not understand) (16/10/96 0454)  Complications: No complications documented.

## 2020-04-07 NOTE — Anesthesia Postprocedure Evaluation (Signed)
Anesthesia Post Note  Patient: Nicole Lambert  Procedure(s) Performed: Bilateral breast reduction with free nipple graft (Bilateral Breast)     Patient location during evaluation: PACU Anesthesia Type: General Level of consciousness: awake and alert, oriented and patient cooperative Pain management: pain level controlled Vital Signs Assessment: post-procedure vital signs reviewed and stable Respiratory status: spontaneous breathing, nonlabored ventilation and respiratory function stable Cardiovascular status: blood pressure returned to baseline and stable Postop Assessment: no apparent nausea or vomiting Anesthetic complications: no   No complications documented.  Last Vitals:  Vitals:   04/07/20 1010 04/07/20 1015  BP:  133/83  Pulse: 71 73  Resp: 16 (!) 22  Temp:    SpO2: 100% 100%    Last Pain:  Vitals:   04/07/20 1015  TempSrc:   PainSc: 0-No pain                 Pervis Hocking

## 2020-04-07 NOTE — Interval H&P Note (Signed)
History and Physical Interval Note:  04/07/2020 7:41 AM  Nicole Lambert  has presented today for surgery, with the diagnosis of macromastia and breast mass.  The various methods of treatment have been discussed with the patient and family. After consideration of risks, benefits and other options for treatment, the patient has consented to  Procedure(s) with comments: Bilateral breast reduction with free nipple graft (Bilateral) - 2 hours as a surgical intervention.  The patient's history has been reviewed, patient examined, no change in status, stable for surgery.  I have reviewed the patient's chart and labs.  Questions were answered to the patient's satisfaction.     [name]

## 2020-04-07 NOTE — Op Note (Signed)
Operative Note   DATE OF OPERATION: 04/07/2020  LOCATION: [facility]   SURGICAL DEPARTMENT: Plastic Surgery  PREOPERATIVE DIAGNOSES: Bilateral symptomatic macromastia.  POSTOPERATIVE DIAGNOSES:  same  PROCEDURE:  1.  Bilateral breast reduction with free nipple graft. 2.  Removal of 3 right breast masses 3cm, 6cm, and 10cm 3.  Removal of 2 left breast masses  14cm, and 5cm   SURGEON: Talmadge Coventry, MD  ASSISTANT: Verdie Shire, PA The advanced practice practitioner (APP) assisted throughout the case.  The APP was essential in retraction and counter traction when needed to make the case progress smoothly.  This retraction and assistance made it possible to see the tissue plans for the procedure.  The assistance was needed for blood control, tissue re-approximation and assisted with closure of the incision site.  ANESTHESIA: General.  COMPLICATIONS: None.   INDICATIONS FOR PROCEDURE:  The patient, Nicole Lambert is a 45 y.o. female born on 1975/06/26, is here for treatment of bilateral symptomatic macromastia. MRN: [id number]  CONSENT:  Informed consent was obtained directly from the patient. Risks, benefits and alternatives were fully discussed. Specific risks including but not limited to bleeding, infection, hematoma, seroma, scarring, pain, infection, contracture, asymmetry, wound healing problems, nipple necrosis, nipple numbness and need for further surgery were all discussed. The patient did have an ample opportunity to have questions answered to satisfaction.   DESCRIPTION OF PROCEDURE:  The patient was marked preoperatively for a Wise pattern skin excision.  The patient was taken to the operating room. SCDs were placed and antibiotics were given. General anesthesia was administered.The patient's operative site was prepped and draped in a sterile fashion. A time out was performed and all information was confirmed to be correct.  Right Breast: The breast was  infiltrated with tumescent solution to help with hemostasis.  The nipple was marked with a cookie cutter and excised in preparation for grafting.  The excision was then performed with cautery.  Care was taken to include all masses within the specimen.  Hemostasis was obtained and the wound was stapled closed.  Left breast:  The breast was infiltrated with tumescent solution to help with hemostasis.  The nipple was marked with a cookie cutter and excised in preparation for grafting.  The excision was then performed with cautery.  Care was taken to include all masses within the specimen.  Hemostasis was obtained and the wound was stapled closed.  Patient was then checked for size and symmetry.  Minor modifications were made.  This resulted in a total of 1135g removed from the right side and 2317g removed from the left side.  The inframammary incision was closed with a combination of buried in-sorb staples and a running 3-0 Quill suture.  The vertical and limbs were closed with interrupted buried 4-0 Monocryl and a running 4-0 Quill suture.  Nipple location was marked with a 50mm cookie cutter and deepithelialized.  Nipple grafts were then thinned out and inset with 4-0 Chromic.  Xeroform and a scrub brush foam were used to fashion and bolster that was secured with 4-0 Nylon.  Steri-Strips were then applied along with a soft dressing and Ace wrap.  The patient tolerated the procedure well.  There were no complications. The patient was allowed to wake from anesthesia, extubated and taken to the recovery room in satisfactory condition.  I was present for the entire procedure.

## 2020-04-07 NOTE — Anesthesia Preprocedure Evaluation (Addendum)
Anesthesia Evaluation  Patient identified by MRN, date of birth, ID band Patient awake    Reviewed: Allergy & Precautions, NPO status , Patient's Chart, lab work & pertinent test results  Airway Mallampati: II  TM Distance: >3 FB Neck ROM: Full    Dental no notable dental hx. (+) Teeth Intact, Dental Advisory Given,    Pulmonary neg pulmonary ROS,    Pulmonary exam normal breath sounds clear to auscultation       Cardiovascular (-) PND negative cardio ROS Normal cardiovascular exam Rhythm:Regular Rate:Normal     Neuro/Psych negative neurological ROS  negative psych ROS   GI/Hepatic negative GI ROS, Neg liver ROS,   Endo/Other  Obesity BMI 34  Renal/GU negative Renal ROS  negative genitourinary   Musculoskeletal negative musculoskeletal ROS (+)   Abdominal (+) + obese,   Peds  Hematology negative hematology ROS (+)   Anesthesia Other Findings Macromastia and breast mass  Reproductive/Obstetrics negative OB ROS                            Anesthesia Physical Anesthesia Plan  ASA: I  Anesthesia Plan: General   Post-op Pain Management:    Induction: Intravenous  PONV Risk Score and Plan: 3 and Ondansetron, Dexamethasone, Midazolam and Treatment may vary due to age or medical condition  Airway Management Planned: LMA  Additional Equipment: None  Intra-op Plan:   Post-operative Plan: Extubation in OR  Informed Consent: I have reviewed the patients History and Physical, chart, labs and discussed the procedure including the risks, benefits and alternatives for the proposed anesthesia with the patient or authorized representative who has indicated his/her understanding and acceptance.     Dental advisory given and Interpreter used for interveiw  Plan Discussed with: CRNA  Anesthesia Plan Comments:         Anesthesia Quick Evaluation

## 2020-04-07 NOTE — Anesthesia Procedure Notes (Signed)
Procedure Name: LMA Insertion Date/Time: 04/07/2020 7:52 AM Performed by: Collier Bullock, CRNA Pre-anesthesia Checklist: Patient identified, Emergency Drugs available, Suction available and Patient being monitored Patient Re-evaluated:Patient Re-evaluated prior to induction Oxygen Delivery Method: Circle system utilized Preoxygenation: Pre-oxygenation with 100% oxygen Induction Type: IV induction LMA: LMA inserted LMA Size: 4.0 Number of attempts: 1 Placement Confirmation: positive ETCO2 and breath sounds checked- equal and bilateral Tube secured with: Tape Dental Injury: Teeth and Oropharynx as per pre-operative assessment

## 2020-04-08 ENCOUNTER — Encounter (HOSPITAL_BASED_OUTPATIENT_CLINIC_OR_DEPARTMENT_OTHER): Payer: Self-pay | Admitting: Plastic Surgery

## 2020-04-08 LAB — SURGICAL PATHOLOGY

## 2020-04-08 NOTE — Telephone Encounter (Signed)
Called and spoke with the patient's husband regarding the message below.   The husband stated that they found the paper prescription for the pain medication.  They took the paper prescription to the pharmacy and picked up the medication on yesterday.//AB/CMA

## 2020-04-08 NOTE — Telephone Encounter (Signed)
Patient confirmed pickup of her postoperative pain medication with her previous phone call noting that she did not have to Zofran.  Patient was provided with a paper prescription for Norco at her preop appointment as it was required for the medication management clinic.  Please call patient and discuss.  We cannot provide an additional prescription as patient has already been provided with a paper prescription for the narcotics.

## 2020-04-15 ENCOUNTER — Ambulatory Visit (INDEPENDENT_AMBULATORY_CARE_PROVIDER_SITE_OTHER): Payer: 59 | Admitting: Plastic Surgery

## 2020-04-15 ENCOUNTER — Encounter: Payer: Self-pay | Admitting: Plastic Surgery

## 2020-04-15 ENCOUNTER — Other Ambulatory Visit: Payer: Self-pay

## 2020-04-15 VITALS — BP 136/85 | HR 81

## 2020-04-15 DIAGNOSIS — N62 Hypertrophy of breast: Secondary | ICD-10-CM

## 2020-04-15 NOTE — Progress Notes (Signed)
Patient presents 1 week postop from bilateral breast reduction with free nipple graft.  She had severe asymmetry and several areas of stromal hyperplasia which were all taken out with the specimens.  She is overall very happy and feels like things are going well.  On exam the bolsters were removed and nipple grafts appear to be adherent.  The rest of her incisions look fine there is no subcutaneous fluid.  We discussed addressing the nipple areolar complexes with Vaseline and a nonstick dressing followed by compressive bra.  She will continue that for the next week we will see her back.  All her questions were answered.

## 2020-04-16 ENCOUNTER — Ambulatory Visit: Payer: Self-pay

## 2020-04-17 ENCOUNTER — Telehealth: Payer: Self-pay

## 2020-04-17 NOTE — Telephone Encounter (Signed)
Patient called to say that she had surgery with Dr. Claudia Desanctis on 04/07/2020 and she feels like her left breast is not ok.  Patient said her left breast feels solid and warm.  Please call.

## 2020-04-20 ENCOUNTER — Other Ambulatory Visit: Payer: Self-pay

## 2020-04-20 ENCOUNTER — Encounter: Payer: Self-pay | Admitting: Plastic Surgery

## 2020-04-20 ENCOUNTER — Ambulatory Visit (INDEPENDENT_AMBULATORY_CARE_PROVIDER_SITE_OTHER): Payer: 59 | Admitting: Plastic Surgery

## 2020-04-20 ENCOUNTER — Telehealth: Payer: Self-pay | Admitting: Surgical

## 2020-04-20 VITALS — BP 113/78 | HR 95

## 2020-04-20 DIAGNOSIS — N62 Hypertrophy of breast: Secondary | ICD-10-CM

## 2020-04-20 NOTE — Telephone Encounter (Signed)
Returned pt call via language interpreter line:  Pt reports that she is having intermittent pain ( 6/10) left breast. Denies any fever/chills Drainage minimal noted on dressings. She feels " hard/masses " left breast & describes the color of the left breast/nipple " blue". She notes the left breast is warm to touch & is more swollen than right. I consulted with Dr. Claudia Desanctis - & he will see her this afternoon @ 2:30pm We will use the language line interpretation for this visit.

## 2020-04-20 NOTE — Progress Notes (Signed)
Patient presents with concerns of left breast swelling and bruising.  On examination she has a small hematoma on the inferolateral aspect of the left breast.  Nipple grafts look fine all of her incisions are intact.  I tried to aspirate this area with a 18-gauge needle but yielded no fluid return.  Of asked her to continue to wear compressive garments and I suspect that her body will absorb this as I do not expect that its very large.  She seemed relieved to know that it should eventually go away on its own and we will plan to see her about a week from now.  I did have a discussion with her and give her the opportunity answer any questions through an Arabic interpreter.

## 2020-04-20 NOTE — Telephone Encounter (Signed)
Patient is following up to the phone call she placed on Friday because she has not heard anything and she still is concerned about her left breast. She called her friend to interpret for her. Left breast feels swollen and there is a hard lump. She has a sharp shooting pain that comes and goes and it's itchy. She also noticed some discoloration on the left breast. Patient requested interpreter to be called when returning her call and that if she has to come in today, to please allow her at least 30 minutes since she lives in [place].

## 2020-04-22 ENCOUNTER — Telehealth: Payer: Self-pay | Admitting: Pharmacist

## 2020-04-22 NOTE — Telephone Encounter (Signed)
Patient failed to provide requested 2022 financial documentation. Unable to determine patient's eligibility status. No additional medication assistance will be provided by [facility] without the required proof of income documentation. Patient notified by letter.  [place]

## 2020-04-23 ENCOUNTER — Encounter: Payer: 59 | Admitting: Plastic Surgery

## 2020-04-23 ENCOUNTER — Ambulatory Visit: Payer: Self-pay

## 2020-04-29 ENCOUNTER — Other Ambulatory Visit: Payer: Self-pay

## 2020-04-29 ENCOUNTER — Ambulatory Visit (INDEPENDENT_AMBULATORY_CARE_PROVIDER_SITE_OTHER): Payer: 59 | Admitting: Plastic Surgery

## 2020-04-29 ENCOUNTER — Encounter: Payer: Self-pay | Admitting: Plastic Surgery

## 2020-04-29 VITALS — BP 119/82 | HR 83

## 2020-04-29 DIAGNOSIS — N62 Hypertrophy of breast: Secondary | ICD-10-CM

## 2020-04-29 NOTE — Progress Notes (Signed)
Patient presents few weeks postop bilateral breast reduction with free nipple graft.  Overall she feels like things are going fine.  She is noted no further swelling or concerns in the area that we evaluated last week on her left side.  On exam all of her incisions are holding together nicely.  There is a firm slightly swollen area in the inferior aspect on the left side just lateral to the vertical limb.  This is unchanged and slightly better in terms of size and firmness compared to last time.  For her nipple grafts it looks like there is a little bit of sloughing of the left nipple graft in 1 area but otherwise there is seem to be doing fine.  We will plan to have her continue to wear compressive garments and avoid strenuous activity.  We discussed how to dress the nipple areolar complexes.  We will see her again in 3 weeks.

## 2020-05-21 ENCOUNTER — Other Ambulatory Visit: Payer: Self-pay

## 2020-05-21 ENCOUNTER — Ambulatory Visit (INDEPENDENT_AMBULATORY_CARE_PROVIDER_SITE_OTHER): Payer: 59 | Admitting: Surgical

## 2020-05-21 DIAGNOSIS — N6489 Other specified disorders of breast: Secondary | ICD-10-CM

## 2020-05-21 DIAGNOSIS — M545 Low back pain, unspecified: Secondary | ICD-10-CM

## 2020-05-21 DIAGNOSIS — M4004 Postural kyphosis, thoracic region: Secondary | ICD-10-CM

## 2020-05-21 DIAGNOSIS — M546 Pain in thoracic spine: Secondary | ICD-10-CM

## 2020-05-21 DIAGNOSIS — N62 Hypertrophy of breast: Secondary | ICD-10-CM

## 2020-05-21 NOTE — Progress Notes (Signed)
Patient is a 45 year old female here for follow-up after bilateral breast reduction via free nipple graft with Dr. Claudia Desanctis on 04/07/2020.  She is 6 weeks postop  She is doing really well.  She is here with a Optometrist.  She reports that she noticed some swelling of the right breast and subsequently changed to a more compressive sports bra and has noticed that this has improved.  She is not having any infectious symptoms.  She has continued with Vaseline gauze to bilateral breast NAC's.  Chaperone present on exam On exam bilateral breast incisions are intact, bilateral NAC's are continuing to heal with new epithelialization noted.  She still does have wounds of the bilateral NAC's from where the grafts are still healing.  There is no erythema.  No cellulitic changes noted.  Bilateral breasts are symmetric and soft and no subcutaneous fluid collections are noted with palpation.  Recommend continue to wear compressive garment. Recommend Vaseline gauze to bilateral NAC's daily to assist with reepithelialization. Recommend following up in 3 weeks for reevaluation.  Recommend calling with questions or concerns.  No sign of infection, seroma, hematoma.

## 2020-06-09 ENCOUNTER — Encounter: Payer: Self-pay | Admitting: Surgical

## 2020-06-09 ENCOUNTER — Other Ambulatory Visit: Payer: Self-pay

## 2020-06-09 ENCOUNTER — Ambulatory Visit (INDEPENDENT_AMBULATORY_CARE_PROVIDER_SITE_OTHER): Payer: 59 | Admitting: Surgical

## 2020-06-09 VITALS — BP 126/87 | HR 71

## 2020-06-09 DIAGNOSIS — N6489 Other specified disorders of breast: Secondary | ICD-10-CM

## 2020-06-09 DIAGNOSIS — Z9889 Other specified postprocedural states: Secondary | ICD-10-CM

## 2020-06-09 NOTE — Progress Notes (Signed)
Patient is a 45 year old female here for follow-up after bilateral breast reduction via free nipple graft with Dr. Claudia Desanctis on 04/07/2020.  She is 9 weeks postop.  Translator is present  She reports overall she is doing really well.  She is continue to use Vaseline and gauze on bilateral NAC's.  She reports that she has noticed some drainage from the right lateral breast.  She is not having any infectious symptoms.  Chaperone present on exam On exam bilateral breast incisions are intact.  She does have a small pinhole wound along the right lateral breast without any surrounding erythema.  No tenderness noted.  No purulent drainage.  No fluctuance is noted.  Bilateral NAC grafts are healing nicely.  She does have some small wound centrally.  Recommend continue with Vaseline and gauze daily for the next week to bilateral NAC's into the right lateral breast wound.  We reviewed preop and postop photos today.  Patient is very appreciative and is very happy with her overall recovery and our care.  Pictures were taken and placed in the patient's chart with her permission.  There is no sign of infection, seroma, hematoma.  We discussed scheduling additional follow-up or following up on an as-needed basis and patient is comfortable following up as needed.

## 2020-07-03 ENCOUNTER — Other Ambulatory Visit: Payer: Self-pay

## 2020-08-27 ENCOUNTER — Telehealth: Payer: Self-pay | Admitting: Pharmacy Technician

## 2020-08-27 NOTE — Telephone Encounter (Signed)
Patient has prescription coverage with [facility].  No longer meets [facility]'s eligibility crtieria.  Patient notifed by letter.  [place], [place]  [id number]   August 27, 2020    [facility] [address] [place], [place]  [address]  Dear Genene Churn:  This is to inform you that you are no longer eligible to receive medication assistance at [facility].  The reason(s) are:    _____Your total gross monthly household income exceeds 250% of the Federal Poverty Level.   _____Tangible assets (savings, checking, stocks/bonds, pension, retirement, etc.) exceeds our limit  _____You are eligible to receive benefits from [facility], [facility] or HIV Medication            Assistance program _____You are eligible to receive benefits from a Medicare Part "D" plan __X__You have prescription insurance with [facility]. _____You are not an [facility] resident _____Failure to provide all requested documentation (proof of income information for 2022., and/or Patient Intake Application, [place], Contract, etc).    We regret that we are unable to help you at this time.  If your prescription coverage is terminated, please contact [facility], so that we may reassess your eligibility for our program.  If you have questions, we may be contacted at [phone].  Thank you,  [facility]

## 2020-09-02 ENCOUNTER — Other Ambulatory Visit: Payer: Self-pay

## 2021-02-02 IMAGING — MG DIGITAL DIAGNOSTIC BILAT W/ CAD
6 series · 6 of 18 positions shown · non-contrast
Comparison: Previous exam(s).

CLINICAL DATA: Status post ultrasound-guided core biopsies of the
RIGHT breast and LEFT axilla

EXAM:
3D DIAGNOSTIC BILATERAL MAMMOGRAM POST ULTRASOUND BIOPSIES

[R CC synth-2D]
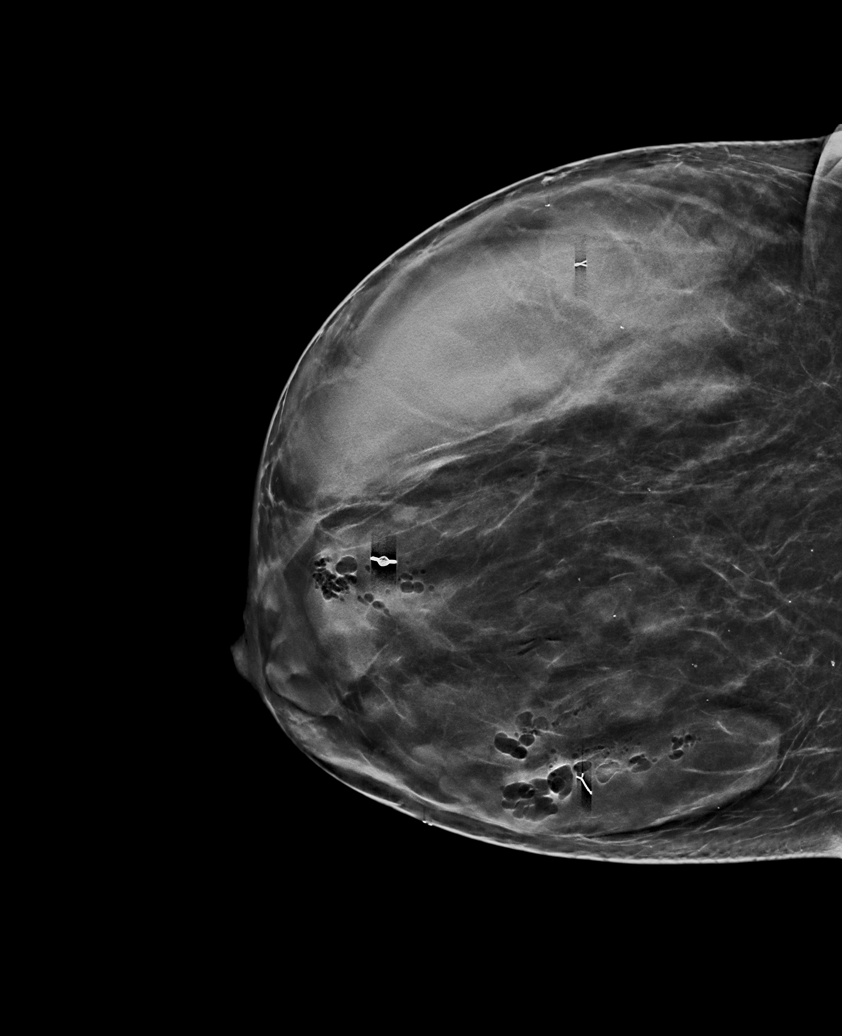

[L MLO synth-2D]
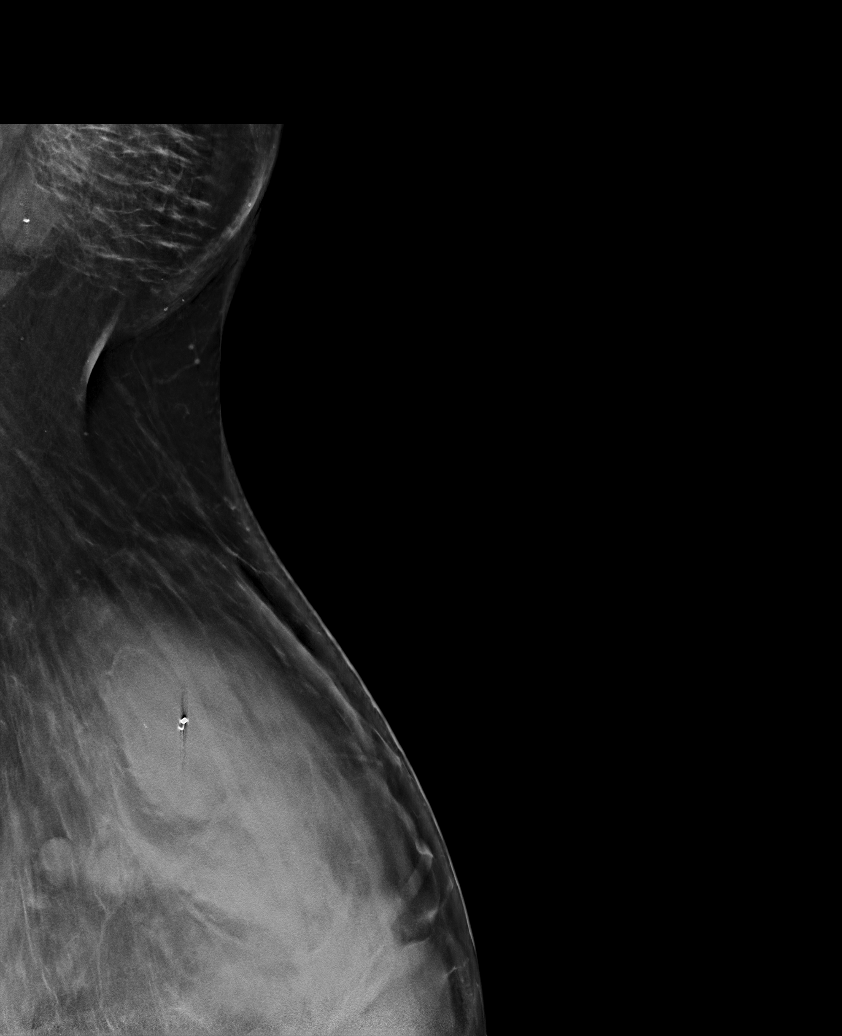

[R ML synth-2D]
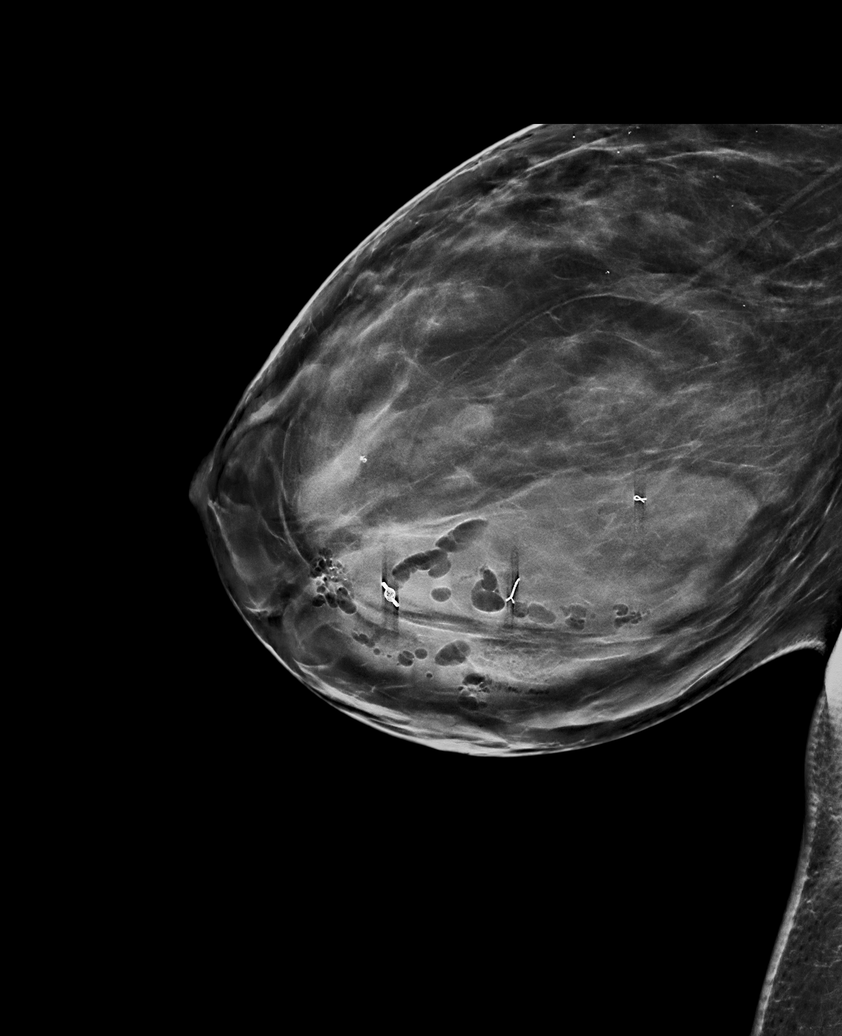

[L MLO tomo · tomo slice 45/90.0]
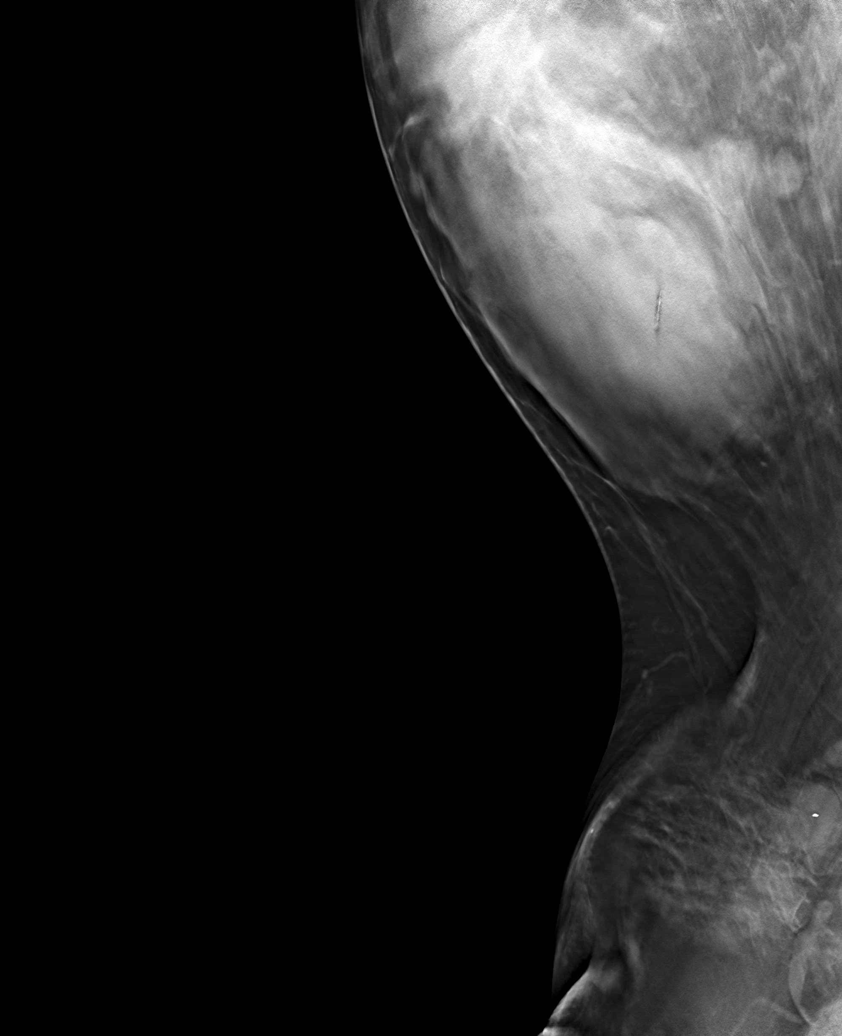

[R ML tomo · tomo slice 37/74.0]
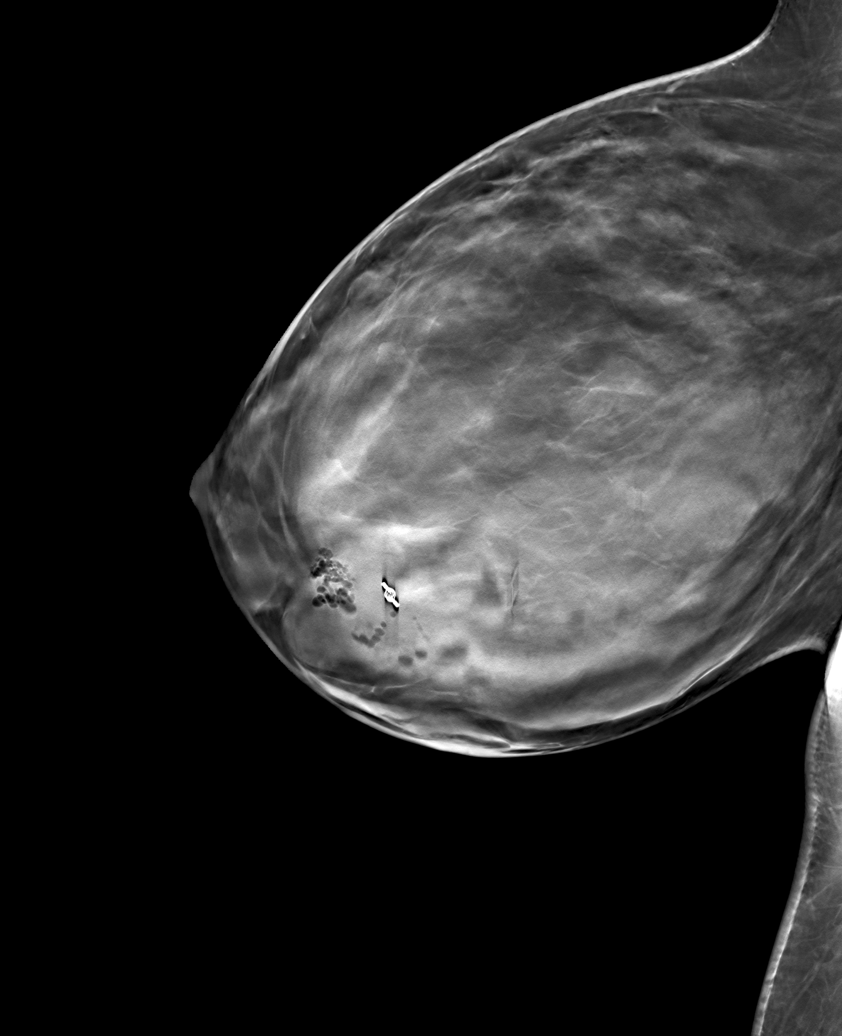

[R CC tomo · tomo slice 35/69.0]
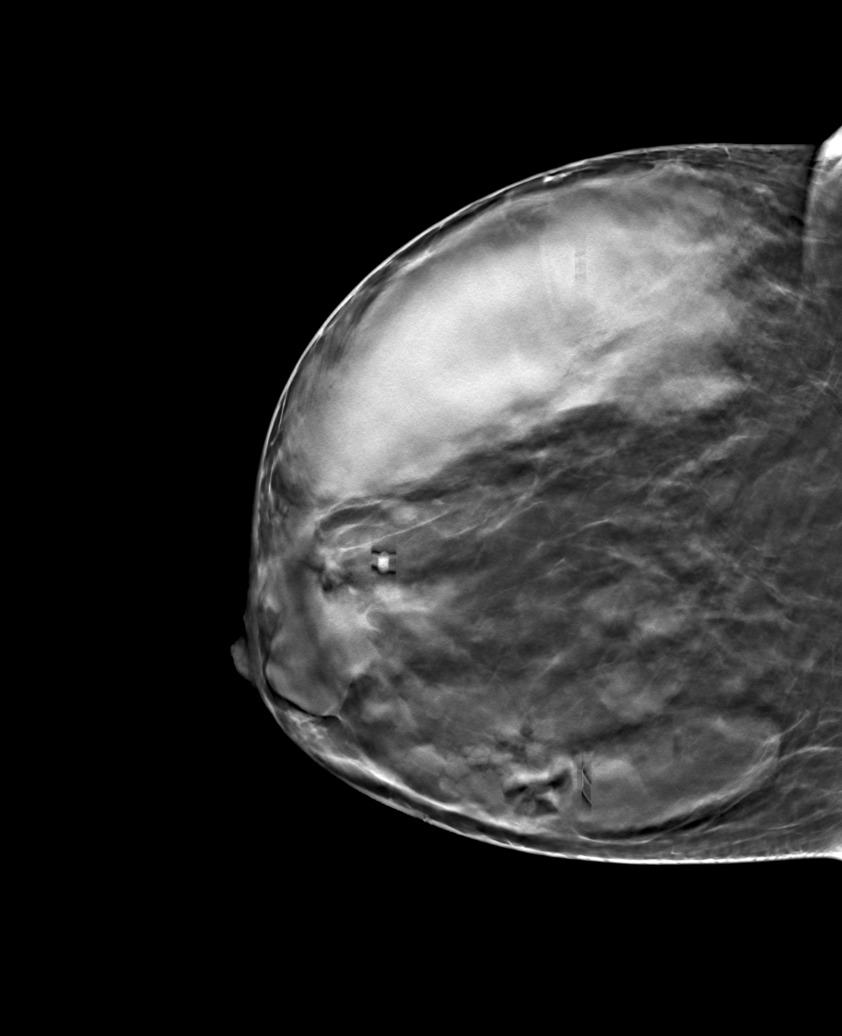

[6 of 18 positions shown; findings below may reference images not displayed]

FINDINGS: 3D Mammographic images were obtained following ultrasound guided
biopsy of mass in the 6 o'clock location of the RIGHT breast and
placement of a guidance shaped clip. The biopsy marking clip is in
expected position at the site of biopsy.

Following biopsy of mass in the 4 o'clock location, 8 twisted FX
clip was placed and is identified in expected location.

Following biopsy of enlarged LEFT axillary lymph node, a spiral
HydroMARK clip was placed in is identified in the expected location.
IMPRESSION: Tissue marker clips are in the expected locations after biopsy.

Final Assessment: Post Procedure Mammograms for Marker Placement

## 2021-06-23 ENCOUNTER — Other Ambulatory Visit: Payer: Self-pay | Admitting: Internal Medicine

## 2021-06-23 DIAGNOSIS — Z1231 Encounter for screening mammogram for malignant neoplasm of breast: Secondary | ICD-10-CM

## 2021-07-27 ENCOUNTER — Ambulatory Visit
Admission: RE | Admit: 2021-07-27 | Discharge: 2021-07-27 | Disposition: A | Discharge status: Home / Self Care | Payer: 59 | Source: Ambulatory Visit | Attending: Internal Medicine | Admitting: Internal Medicine

## 2021-07-27 DIAGNOSIS — Z1231 Encounter for screening mammogram for malignant neoplasm of breast: Secondary | ICD-10-CM | POA: Diagnosis present

## 2021-10-04 ENCOUNTER — Other Ambulatory Visit (HOSPITAL_COMMUNITY)
Admission: RE | Admit: 2021-10-04 | Discharge: 2021-10-04 | Disposition: A | Discharge status: Home / Self Care | Payer: 59 | Source: Ambulatory Visit | Attending: Obstetrics & Gynecology | Admitting: Obstetrics & Gynecology

## 2021-10-04 ENCOUNTER — Encounter: Payer: Self-pay | Admitting: Obstetrics & Gynecology

## 2021-10-04 ENCOUNTER — Ambulatory Visit (INDEPENDENT_AMBULATORY_CARE_PROVIDER_SITE_OTHER): Payer: 59 | Admitting: Obstetrics & Gynecology

## 2021-10-04 VITALS — BP 120/80 | Ht 62.0 in | Wt 178.0 lb

## 2021-10-04 DIAGNOSIS — N898 Other specified noninflammatory disorders of vagina: Secondary | ICD-10-CM | POA: Insufficient documentation

## 2021-10-04 NOTE — Progress Notes (Signed)
   Subjective:    Patient ID: Nicole Lambert, female    DOB: Feb 19, 1975, 46 y.o.   MRN: [id number]  HPI 46 yo Arabic-speaking married P4 is here today with the concern of a 3+ month h/o vaginal odor and discharge. She has tried washing the outside of the vulva with baking soda and water but has not used anything internally.  She reports that she is having her period today, is sexually active, and uses condoms for contraception.   Review of Systems Her mammogram was done 07/27/2021 and her last pap was in 2020.     Objective:   Physical Exam  Well nourished, well hydrated Black female, no apparent distress She is ambulating and conversing normally. She has an interpretor here for this visit.  Spec exam reveals a normal appearing cervix, no discharge seen, moderate blood from her period      Assessment & Plan:   Reported vaginal odor and discharge Aptima sent She will come back next week for annual and discussion of results

## 2021-10-05 ENCOUNTER — Other Ambulatory Visit: Payer: Self-pay

## 2021-10-05 ENCOUNTER — Other Ambulatory Visit: Payer: Self-pay | Admitting: Obstetrics & Gynecology

## 2021-10-05 DIAGNOSIS — B9689 Other specified bacterial agents as the cause of diseases classified elsewhere: Secondary | ICD-10-CM

## 2021-10-05 DIAGNOSIS — N898 Other specified noninflammatory disorders of vagina: Secondary | ICD-10-CM

## 2021-10-05 DIAGNOSIS — B3731 Acute candidiasis of vulva and vagina: Secondary | ICD-10-CM

## 2021-10-05 LAB — CERVICOVAGINAL ANCILLARY ONLY
Bacterial Vaginitis (gardnerella): POSITIVE — AB
Candida Glabrata: NEGATIVE
Candida Vaginitis: POSITIVE — AB
Chlamydia: NEGATIVE
Comment: NEGATIVE
Comment: NEGATIVE
Comment: NEGATIVE
Comment: NEGATIVE
Comment: NEGATIVE
Comment: NORMAL
Neisseria Gonorrhea: NEGATIVE
Trichomonas: NEGATIVE

## 2021-10-05 MED ORDER — METRONIDAZOLE 500 MG PO TABS
500.0000 mg | ORAL_TABLET | Freq: Two times a day (BID) | ORAL | 0 refills | Status: DC
  Filled 2021-10-05: qty 14, 7d supply, fill #0

## 2021-10-05 MED ORDER — FLUCONAZOLE 150 MG PO TABS
150.0000 mg | ORAL_TABLET | Freq: Once | ORAL | 0 refills | Status: AC
Start: 2021-10-05 — End: 2021-10-07
  Filled 2021-10-05: qty 1, 1d supply, fill #0

## 2021-10-05 NOTE — Progress Notes (Signed)
Flagyl and diflucan prescribed for yeast and bv seen on Aptima.

## 2021-10-06 ENCOUNTER — Telehealth: Payer: Self-pay

## 2021-10-06 ENCOUNTER — Other Ambulatory Visit: Payer: Self-pay

## 2021-10-06 NOTE — Telephone Encounter (Signed)
-----   Message from Emily Filbert, MD sent at 10/05/2021  1:06 PM EDT ----- Please let her know that I have prescribed meds to fix her vaginal issues. She speaks Arabic. Thanks

## 2021-10-06 NOTE — Telephone Encounter (Signed)
Used interpreter and pt aware of test results and Rx's sent to pharmacy. She said she saw results in mychart and that Rx's were sent to pharmacy and she just picked them up a few mins ago. Will f/u prn.

## 2021-10-11 ENCOUNTER — Other Ambulatory Visit (HOSPITAL_COMMUNITY)
Admission: RE | Admit: 2021-10-11 | Discharge: 2021-10-11 | Disposition: A | Discharge status: Home / Self Care | Payer: 59 | Source: Ambulatory Visit | Attending: Obstetrics & Gynecology | Admitting: Obstetrics & Gynecology

## 2021-10-11 ENCOUNTER — Ambulatory Visit (INDEPENDENT_AMBULATORY_CARE_PROVIDER_SITE_OTHER): Payer: 59 | Admitting: Obstetrics & Gynecology

## 2021-10-11 ENCOUNTER — Encounter: Payer: Self-pay | Admitting: Obstetrics & Gynecology

## 2021-10-11 VITALS — BP 126/80 | Ht 62.0 in | Wt 177.8 lb

## 2021-10-11 DIAGNOSIS — R011 Cardiac murmur, unspecified: Secondary | ICD-10-CM | POA: Diagnosis not present

## 2021-10-11 DIAGNOSIS — Z01419 Encounter for gynecological examination (general) (routine) without abnormal findings: Secondary | ICD-10-CM | POA: Diagnosis not present

## 2021-10-11 DIAGNOSIS — Z124 Encounter for screening for malignant neoplasm of cervix: Secondary | ICD-10-CM | POA: Insufficient documentation

## 2021-10-11 NOTE — Progress Notes (Signed)
Subjective:    Nicole Lambert is a 46 y.o. married P4  who presents for an annual exam. The patient has no complaints today. The patient is sexually active. GYN screening history: last pap: was normal. The patient wears seatbelts: yes. The patient participates in regular exercise: no. Has the patient ever been transfused or tattooed?: no. The patient reports that there is not domestic violence in her life.   She is taking flagyl for BV, finished the diflucan for yeast, and reports that her vagina is better now.  Menstrual History: OB History     Gravida  4   Para  4   Term  4   Preterm      AB      Living  4      SAB      IAB      Ectopic      Multiple      Live Births  55           Menarche age: 10 Patient's last menstrual period was 10/03/2021. Period Duration (Days): 5-7 Period Pattern: (!) Irregular Menstrual Flow: Heavy, Light Menstrual Control: Maxi pad, Thin pad Menstrual Control Change Freq (Hours): 2-3 Dysmenorrhea: None  The following portions of the patient's history were reviewed and updated as appropriate: allergies, current medications, past family history, past medical history, past social history, past surgical history, and problem list.  Review of Systems Pertinent items are noted in HPI.  She denies a family history of breast/gyn/colon cancer. She is a Agricultural engineer. She uses condoms for contraception Mammogram 07/2021   Objective:    BP 126/80   Ht '5\' 2"'$  (1.575 m)   Wt 177 lb 12.8 oz (80.6 kg)   LMP 10/03/2021   BMI 32.52 kg/m   General Appearance:    Alert, cooperative, no distress, appears stated age  Head:    Normocephalic, without obvious abnormality, atraumatic  Eyes:    PERRL, conjunctiva/corneas clear, EOM's intact, fundi    benign, both eyes  Ears:    Normal TM's and external ear canals, both ears  Nose:   Nares normal, septum midline, mucosa normal, no drainage    or sinus tenderness  Throat:   Lips, mucosa, and tongue normal;  teeth and gums normal  Neck:   Supple, symmetrical, trachea midline, no adenopathy;    thyroid:  no enlargement/tenderness/nodules; no carotid   bruit or JVD  Back:     Symmetric, no curvature, ROM normal, no CVA tenderness  Lungs:     Clear to auscultation bilaterally, respirations unlabored  Chest Wall:    No tenderness or deformity   Heart:    Regular rate and rhythm, S1 and S2 normal, 2/6 SEM (patient denies any knowledge of this)  Breast Exam:    No tenderness, masses, or nipple abnormality  Abdomen:     Soft, non-tender, bowel sounds active all four quadrants,    no masses, no organomegaly  Genitalia:    Normal female without lesion, discharge or tenderness,     Extremities:   Extremities normal, atraumatic, no cyanosis or edema  Pulses:   2+ and symmetric all extremities  Skin:   Skin color, texture, turgor normal, no rashes or lesions  Lymph nodes:   Cervical, supraclavicular, and axillary nodes normal  Neurologic:   CNII-XII intact, normal strength, sensation and reflexes    throughout  .    Assessment:    Healthy female exam.  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 With HPV cotesting Referral to cardiology made

## 2021-10-14 LAB — CYTOLOGY - PAP
Comment: NEGATIVE
Diagnosis: NEGATIVE
High risk HPV: NEGATIVE

## 2021-10-15 ENCOUNTER — Encounter: Payer: Self-pay | Admitting: Obstetrics & Gynecology

## 2021-10-22 ENCOUNTER — Ambulatory Visit: Payer: 59 | Admitting: Cardiology

## 2021-11-01 ENCOUNTER — Ambulatory Visit: Payer: 59 | Admitting: Internal Medicine

## 2021-11-01 ENCOUNTER — Encounter: Payer: Self-pay | Admitting: Internal Medicine

## 2021-11-01 VITALS — BP 123/82 | HR 73 | Temp 97.6°F | Resp 16 | Ht 62.0 in | Wt 176.0 lb

## 2021-11-01 DIAGNOSIS — I10 Essential (primary) hypertension: Secondary | ICD-10-CM | POA: Diagnosis not present

## 2021-11-01 DIAGNOSIS — E782 Mixed hyperlipidemia: Secondary | ICD-10-CM | POA: Diagnosis not present

## 2021-11-01 DIAGNOSIS — R011 Cardiac murmur, unspecified: Secondary | ICD-10-CM

## 2021-11-01 NOTE — Progress Notes (Signed)
Primary Physician/Referring:  Nicole Lambert, No Pcp Per  Nicole Lambert ID: [name], female    DOB: December 18, 1975, 46 y.o.   MRN: [id number]  No chief complaint on file.  HPI:    Nicole Lambert  is a 46 y.o. female with past medical history significant for hypertension, hyperlipidemia, and family history of sudden cardiac death who is here to establish care with cardiology. Her ob/gyn noted a murmur on exam during their last visit. Given her family history, Nicole Lambert is certainly at increased risk for cardiovascular disease, especially with her own medical history. She has never had an echocardiogram in the past. She does not smoke or drink alcohol. Denies chest pain, shortness of breath, palpitations, diaphoresis, syncope, edema, PND, orthopnea.   Past Medical History:  Diagnosis Date  . Hyperlipemia   . Hypertension    Past Surgical History:  Procedure Laterality Date  . BREAST BIOPSY Left 03/14/2019   [place] bx 12:00 coil marker, PSEUDOANGIOMATOUS STROMAL HYPERPLASIA   . BREAST BIOPSY Left 03/14/2019   [place] bx 3:00 PSEUDOANGIOMATOUS STROMAL HYPERPLASIA   . BREAST BIOPSY Right 03/14/2019   [place] bx ribbon marker PASH and GYNECOMASTIA-LIKE HYPERPLASIA  . BREAST BIOPSY Right 02/03/2020   6:00-vision, PSEUDOANGIOMATOUS STROMAL HYPERPLASIA. - FOCAL COLUMNAR CELL CHANGE USUAL HYPERPLASIA  . BREAST BIOPSY Right 02/03/2020   4:00-x shape, PSEUDOANGIOMATOUS STROMAL HYPERPLASIA. - COLUMNAR CELL CHANGE AND USUALY HYPERPLASIA  . BREAST BIOPSY Left 02/03/2020   axilla hydro marker,NEG  . BREAST REDUCTION SURGERY Bilateral 04/07/2020   Procedure: Bilateral breast reduction with free nipple graft;  Surgeon: [name], MD;  Location: [place];  Service: Plastics;  Laterality: Bilateral;  2 hours   Family History  Problem Relation Age of Onset  . Kidney disease Cousin   . Lung cancer Maternal Aunt   . Adrenal disorder Neg Hx     Social History   Tobacco Use  . Smoking status: Never  .  Smokeless tobacco: Never  Substance Use Topics  . Alcohol use: Never   Marital Status: Married  ROS  Review of Systems  Cardiovascular:  Positive for irregular heartbeat. Negative for chest pain, claudication, cyanosis, near-syncope, paroxysmal nocturnal dyspnea and syncope.  All other systems reviewed and are negative. Objective  Blood pressure 123/82, pulse 73, temperature 97.6 F (36.4 C), temperature source Temporal, resp. rate 16, height '5\' 2"'$  (1.575 m), weight 176 lb (79.8 kg), last menstrual period 10/03/2021, SpO2 96 %. Body mass index is 32.19 kg/m.     11/01/2021    8:43 AM 10/11/2021    9:00 AM 10/04/2021    9:09 AM  Vitals with BMI  Height '5\' 2"'$  '5\' 2"'$  '5\' 2"'$   Weight 176 lbs 177 lbs 13 oz 178 lbs  BMI 32.18 78.46 96.29  Systolic 528 413 244  Diastolic 82 80 80  Pulse 73       Physical Exam Vitals and nursing note reviewed.  Constitutional:      General: She is not in acute distress.    Appearance: Normal appearance.  HENT:     Head: Normocephalic and atraumatic.  Neck:     Vascular: No carotid bruit.  Cardiovascular:     Rate and Rhythm: Normal rate and regular rhythm.     Pulses: Normal pulses.     Heart sounds: Murmur (very faint murmur) heard.     No gallop.  Pulmonary:     Effort: Pulmonary effort is normal.     Breath sounds: Normal breath sounds.  Abdominal:  General: Bowel sounds are normal.     Palpations: Abdomen is soft.  Musculoskeletal:     Right lower leg: No edema.     Left lower leg: No edema.  Skin:    General: Skin is warm and dry.  Neurological:     Mental Status: She is alert.   Medications and allergies  No Known Allergies   Medication list after today's encounter   Current Outpatient Medications:  .  amLODipine (NORVASC) 5 MG tablet, Take 5 mg by mouth daily., Disp: , Rfl:  .  aspirin EC 81 MG tablet, Take 81 mg by mouth daily. Swallow whole., Disp: , Rfl:  .  atorvastatin (LIPITOR) 10 MG tablet, Take 10 mg by mouth daily.,  Disp: , Rfl:   Laboratory examination:   Lab Results  Component Value Date   NA 142 01/02/2020   K 3.6 01/02/2020   CO2 21 01/02/2020   GLUCOSE 117 (H) 01/02/2020   BUN 5 (L) 01/02/2020   CREATININE 0.67 01/02/2020   CALCIUM 9.3 01/02/2020   GFRNONAA 106 01/02/2020       Latest Ref Rng & Units 01/02/2020    6:39 PM 09/13/2019    8:58 AM 06/12/2019   10:38 AM  CMP  Glucose 65 - 99 mg/dL 117  149  99   BUN 6 - 24 mg/dL '5  8  11   '$ Creatinine 0.57 - 1.00 mg/dL 0.67  0.65  0.69   Sodium 134 - 144 mmol/L 142  142  141   Potassium 3.5 - 5.2 mmol/L 3.6  4.3  4.1   Chloride 96 - 106 mmol/L 104  106  106   CO2 20 - 29 mmol/L '21  26  23   '$ Calcium 8.7 - 10.2 mg/dL 9.3  10.1  9.4   Total Protein 6.0 - 8.5 g/dL 6.9   6.6   Total Bilirubin 0.0 - 1.2 mg/dL 0.3   0.3   Alkaline Phos 44 - 121 IU/L 85   68   AST 0 - 40 IU/L 13   13   ALT 0 - 32 IU/L 13   13       Latest Ref Rng & Units 01/02/2020    6:39 PM 06/12/2019   10:38 AM 04/18/2018   11:22 AM  CBC  WBC 3.4 - 10.8 x10E3/uL 6.2  6.0  7.3   Hemoglobin 11.1 - 15.9 g/dL 14.3  12.3  13.3   Hematocrit 34.0 - 46.6 % 40.2  37.0  38.7   Platelets 150 - 450 x10E3/uL  218  258     Lipid Panel No results for input(s): "CHOL", "TRIG", "LDLCALC", "VLDL", "HDL", "CHOLHDL", "LDLDIRECT" in the last 8760 hours.  HEMOGLOBIN A1C Lab Results  Component Value Date   HGBA1C 5.6 01/02/2020   TSH No results for input(s): "TSH" in the last 8760 hours.  External labs:     Radiology:    Cardiac Studies:   No results found for this or any previous visit from the past 1095 days.     No results found for this or any previous visit from the past 1095 days.     EKG:     11/01/21 normal sinus rhythm  Assessment     ICD-10-CM   1. Murmur  R01.1 EKG 12-Lead    PCV ECHOCARDIOGRAM COMPLETE    2. Mixed hyperlipidemia  E78.2 PCV ECHOCARDIOGRAM COMPLETE    3. Essential hypertension  I10 PCV ECHOCARDIOGRAM COMPLETE  Orders Placed  This Encounter  Procedures  . EKG 12-Lead  . PCV ECHOCARDIOGRAM COMPLETE    Standing Status:   Future    Standing Expiration Date:   11/02/2022    No orders of the defined types were placed in this encounter.   Medications Discontinued During This Encounter  Medication Reason  . metroNIDAZOLE (FLAGYL) 500 MG tablet      Recommendations:   Nyra Anspaugh is a 46 y.o.  with HTN, HLD, and family history of sudden cardiac death in her mother  Continue current cardiac medications. Discussed with Nicole Lambert, will not change any medications at this time. Encourage low-sodium diet, less than 2000 mg daily. Nicole Lambert's mom died suddenly of massive MI without any warning or symptoms beforehand. Previously her mother had no health issues and the cause was never identified as to why she died of sudden MI at young age. Schedule imaging tests in office - echocardiogram. Female tech to scan this Nicole Lambert Follow-up in 3 months or sooner if needed.     Floydene Flock, DO, [facility]  11/01/2021, 9:08 AM Office: [phone] Pager: [phone]

## 2021-11-29 ENCOUNTER — Ambulatory Visit: Payer: 59

## 2021-11-29 DIAGNOSIS — R011 Cardiac murmur, unspecified: Secondary | ICD-10-CM

## 2021-11-29 DIAGNOSIS — E782 Mixed hyperlipidemia: Secondary | ICD-10-CM

## 2021-11-29 DIAGNOSIS — I10 Essential (primary) hypertension: Secondary | ICD-10-CM

## 2022-01-25 ENCOUNTER — Ambulatory Visit: Payer: Self-pay | Admitting: Internal Medicine

## 2022-01-27 ENCOUNTER — Ambulatory Visit: Payer: 59 | Admitting: Internal Medicine

## 2022-03-07 NOTE — Progress Notes (Signed)
No show

## 2022-06-13 ENCOUNTER — Encounter: Payer: Self-pay | Admitting: Internal Medicine

## 2022-06-13 ENCOUNTER — Other Ambulatory Visit: Payer: Self-pay | Admitting: Internal Medicine

## 2022-06-13 DIAGNOSIS — Z1231 Encounter for screening mammogram for malignant neoplasm of breast: Secondary | ICD-10-CM

## 2022-08-01 ENCOUNTER — Ambulatory Visit
Admission: RE | Admit: 2022-08-01 | Discharge: 2022-08-01 | Disposition: A | Discharge status: Home / Self Care | Payer: BLUE CROSS/BLUE SHIELD | Source: Ambulatory Visit | Attending: Internal Medicine | Admitting: Internal Medicine

## 2022-08-01 DIAGNOSIS — Z1231 Encounter for screening mammogram for malignant neoplasm of breast: Secondary | ICD-10-CM | POA: Diagnosis present

## 2023-10-18 ENCOUNTER — Other Ambulatory Visit: Payer: Self-pay | Admitting: Physician Assistant

## 2023-10-18 DIAGNOSIS — N6452 Nipple discharge: Secondary | ICD-10-CM

## 2023-10-25 ENCOUNTER — Ambulatory Visit
Admission: RE | Admit: 2023-10-25 | Discharge: 2023-10-25 | Disposition: A | Discharge status: Home / Self Care | Source: Ambulatory Visit | Attending: Physician Assistant | Admitting: Physician Assistant

## 2023-10-25 ENCOUNTER — Other Ambulatory Visit: Payer: Self-pay | Admitting: Physician Assistant

## 2023-10-25 DIAGNOSIS — N6452 Nipple discharge: Secondary | ICD-10-CM | POA: Diagnosis present

## 2024-01-17 ENCOUNTER — Ambulatory Visit: Admitting: Anesthesiology

## 2024-01-17 ENCOUNTER — Ambulatory Visit
Admission: RE | Admit: 2024-01-17 | Discharge: 2024-01-17 | Disposition: A | Attending: Gastroenterology | Admitting: Gastroenterology

## 2024-01-17 ENCOUNTER — Encounter: Admission: RE | Disposition: A | Payer: Self-pay | Source: Home / Self Care | Attending: Gastroenterology

## 2024-01-17 LAB — POCT PREGNANCY, URINE: Preg Test, Ur: NEGATIVE

## 2024-01-17 SURGERY — COLONOSCOPY
Anesthesia: General

## 2024-01-17 MED ORDER — PROPOFOL 10 MG/ML IV BOLUS
INTRAVENOUS | Status: DC | PRN
  Administered 2024-01-17: 70 mg via INTRAVENOUS
  Administered 2024-01-17: 30 mg via INTRAVENOUS
  Administered 2024-01-17 (×2): 40 mg via INTRAVENOUS
  Administered 2024-01-17: 30 mg via INTRAVENOUS
  Administered 2024-01-17: 20 mg via INTRAVENOUS

## 2024-01-17 MED ORDER — GLYCOPYRROLATE 0.2 MG/ML IJ SOLN
INTRAMUSCULAR | Status: DC | PRN
  Administered 2024-01-17 (×2): .2 mg via INTRAVENOUS

## 2024-01-17 MED ORDER — LIDOCAINE HCL (CARDIAC) PF 100 MG/5ML IV SOSY
PREFILLED_SYRINGE | INTRAVENOUS | Status: DC | PRN
  Administered 2024-01-17: 40 mg via INTRAVENOUS

## 2024-01-17 MED ORDER — SODIUM CHLORIDE 0.9 % IV SOLN
INTRAVENOUS | Status: DC
  Administered 2024-01-17: 20 mL/h via INTRAVENOUS

## 2024-01-17 NOTE — Anesthesia Postprocedure Evaluation (Signed)
 Anesthesia Post Note  Patient: Nicole Lambert  Procedure(s) Performed: COLONOSCOPY  Patient location during evaluation: Endoscopy Anesthesia Type: General Level of consciousness: awake and alert Pain management: pain level controlled Vital Signs Assessment: post-procedure vital signs reviewed and stable Respiratory status: spontaneous breathing, nonlabored ventilation and respiratory function stable Cardiovascular status: blood pressure returned to baseline and stable Postop Assessment: no apparent nausea or vomiting Anesthetic complications: no   No notable events documented.   Last Vitals:  Vitals:   01/17/24 1202 01/17/24 1212  BP:  108/76  Pulse:    Resp:    Temp: 36.8 C   SpO2:      Last Pain:  Vitals:   01/17/24 1222  TempSrc:   PainSc: 0-No pain                 Camellia Merilee Louder

## 2024-01-17 NOTE — H&P (Signed)
 Nicole JONELLE Brooklyn, MD [facility], DHIP [address]  [place], [place] [address]  Main: [phone] Fax:  [phone] Pager: [phone]   Primary Care Physician:  Sadie Manna, MD Primary Gastroenterologist:  Dr. Corinn JONELLE Lambert  Pre-Procedure History & Physical: HPI:  Nicole Lambert is a 48 y.o. female is here for an colonoscopy.   Past Medical History:  Diagnosis Date   Hyperlipemia    Hypertension     Past Surgical History:  Procedure Laterality Date   BREAST BIOPSY Left 03/14/2019   us  bx 12:00 coil marker, PSEUDOANGIOMATOUS STROMAL HYPERPLASIA    BREAST BIOPSY Left 03/14/2019   us  bx 3:00 PSEUDOANGIOMATOUS STROMAL HYPERPLASIA    BREAST BIOPSY Right 03/14/2019   us  bx ribbon marker PASH and GYNECOMASTIA-LIKE HYPERPLASIA   BREAST BIOPSY Right 02/03/2020   6:00-vision, PSEUDOANGIOMATOUS STROMAL HYPERPLASIA. - FOCAL COLUMNAR CELL CHANGE USUAL HYPERPLASIA   BREAST BIOPSY Right 02/03/2020   4:00-x shape, PSEUDOANGIOMATOUS STROMAL HYPERPLASIA. - COLUMNAR CELL CHANGE AND USUALY HYPERPLASIA   BREAST BIOPSY Left 02/03/2020   axilla hydro marker,NEG   BREAST REDUCTION SURGERY Bilateral 04/07/2020   Procedure: Bilateral breast reduction with free nipple graft;  Surgeon: Elisabeth Craig RAMAN, MD;  Location: [facility];  Service: Plastics;  Laterality: Bilateral;  2 hours    Prior to Admission medications   Medication Sig Start Date End Date Taking? Authorizing Provider  amLODipine  (NORVASC ) 5 MG tablet Take 5 mg by mouth daily. 08/25/21  Yes [provider]  aspirin EC 81 MG tablet Take 81 mg by mouth daily. Swallow whole.   Yes [provider]  atorvastatin (LIPITOR) 10 MG tablet Take 10 mg by mouth daily. 09/18/21  Yes [provider]    Allergies as of 01/04/2024   (No Known Allergies)    Family History  Problem Relation Age of Onset   Kidney disease Cousin    Lung cancer Maternal Aunt    Adrenal  disorder Neg Hx     Social History   Socioeconomic History   Marital status: Married    Spouse name: Not on file   Number of children: 4   Years of education: Not on file   Highest education level: Bachelor's degree (e.g., BA, AB, BS)  Occupational History   Occupation: unemployed  Tobacco Use   Smoking status: Never   Smokeless tobacco: Never  Vaping Use   Vaping status: Never Used  Substance and Sexual Activity   Alcohol use: Never   Drug use: Never   Sexual activity: Yes    Birth control/protection: None  Other Topics Concern   Not on file  Social History Narrative   Not on file   Social Drivers of Health   Financial Resource Strain: Medium Risk (10/20/2023)   Received from [facility]   Overall Financial Resource Strain (CARDIA)    Difficulty of Paying Living Expenses: Somewhat hard  Food Insecurity: Food Insecurity Present (10/20/2023)   Received from [facility]   Hunger Vital Sign    Within the past 12 months, you worried that your food would run out before you got the money to buy more.: Sometimes true    Within the past 12 months, the food you bought just didn't last and you didn't have money to get more.: Sometimes true  Transportation Needs: No Transportation Needs (10/20/2023)   Received from [facility] - Transportation    In the past 12 months, has  lack of transportation kept you from medical appointments or from getting medications?: No    Lack of Transportation (Non-Medical): No  Physical Activity: Unknown (07/18/2019)   Exercise Vital Sign    Days of Exercise per Week: 0 days    Minutes of Exercise per Session: Not on file  Stress: Not on file  Social Connections: Not on file  Intimate Partner Violence: Not At Risk (06/06/2019)   Humiliation, Afraid, Rape, and Kick questionnaire    Fear of Current or Ex-Partner: No    Emotionally Abused: No    Physically Abused: No    Sexually Abused: No     Review of Systems: See HPI, otherwise negative ROS  Physical Exam: BP 106/73   Pulse 70   Temp (!) 96.8 F (36 C) (Temporal)   Resp (!) 22   Ht 5' 5 (1.651 m)   Wt 81.4 kg   SpO2 100%   BMI 29.85 kg/m  General:   Alert,  pleasant and cooperative in NAD Head:  Normocephalic and atraumatic. Neck:  Supple; no masses or thyromegaly. Lungs:  Clear throughout to auscultation.    Heart:  Regular rate and rhythm. Abdomen:  Soft, nontender and nondistended. Normal bowel sounds, without guarding, and without rebound.   Neurologic:  Alert and  oriented x4;  grossly normal neurologically.  Impression/Plan: Nicole Lambert is here for an colonoscopy to be performed for colon cancer screening  Risks, benefits, limitations, and alternatives regarding  colonoscopy have been reviewed with the patient.  Questions have been answered.  All parties agreeable.   Nicole Brooklyn, MD  01/17/2024, 10:23 AM

## 2024-01-17 NOTE — Transfer of Care (Signed)
 Immediate Anesthesia Transfer of Care Note  Patient: Nicole Lambert  Procedure(s) Performed: COLONOSCOPY  Patient Location: PACU  Anesthesia Type:General  Level of Consciousness: sedated  Airway & Oxygen Therapy: Patient Spontanous Breathing  Post-op Assessment: Report given to RN and Post -op Vital signs reviewed and stable  Post vital signs: Reviewed and stable  Last Vitals:  Vitals Value Taken Time  BP 103/64 01/17/24 12:01  Temp    Pulse 98 01/17/24 12:02  Resp 25 01/17/24 12:02  SpO2 96 % 01/17/24 12:02  Vitals shown include unfiled device data.  Last Pain:  Vitals:   01/17/24 1008  TempSrc: Temporal  PainSc: 0-No pain         Complications: No notable events documented.

## 2024-01-17 NOTE — Anesthesia Preprocedure Evaluation (Addendum)
 Anesthesia Evaluation  Patient identified by MRN, date of birth, ID band Patient awake    Reviewed: Allergy & Precautions, H&P , NPO status , Patient's Chart, lab work & pertinent test results  Airway Mallampati: III  TM Distance: >3 FB Neck ROM: full    Dental no notable dental hx.    Pulmonary neg pulmonary ROS   Pulmonary exam normal        Cardiovascular hypertension, Normal cardiovascular exam     Neuro/Psych negative neurological ROS  negative psych ROS   GI/Hepatic negative GI ROS, Neg liver ROS,,,  Endo/Other  negative endocrine ROS    Renal/GU negative Renal ROS  negative genitourinary   Musculoskeletal   Abdominal Normal abdominal exam  (+)   Peds  Hematology negative hematology ROS (+)   Anesthesia Other Findings Past Medical History: No date: Hyperlipemia No date: Hypertension  Past Surgical History: 03/14/2019: BREAST BIOPSY; Left     Comment:  us  bx 12:00 coil marker, PSEUDOANGIOMATOUS STROMAL               HYPERPLASIA  03/14/2019: BREAST BIOPSY; Left     Comment:  us  bx 3:00 PSEUDOANGIOMATOUS STROMAL HYPERPLASIA  03/14/2019: BREAST BIOPSY; Right     Comment:  us  bx ribbon marker PASH and GYNECOMASTIA-LIKE               HYPERPLASIA 02/03/2020: BREAST BIOPSY; Right     Comment:  6:00-vision, PSEUDOANGIOMATOUS STROMAL HYPERPLASIA. -               FOCAL COLUMNAR CELL CHANGE USUAL HYPERPLASIA 02/03/2020: BREAST BIOPSY; Right     Comment:  4:00-x shape, PSEUDOANGIOMATOUS STROMAL HYPERPLASIA. -               COLUMNAR CELL CHANGE AND USUALY HYPERPLASIA 02/03/2020: BREAST BIOPSY; Left     Comment:  axilla hydro marker,NEG 04/07/2020: BREAST REDUCTION SURGERY; Bilateral     Comment:  Procedure: Bilateral breast reduction with free nipple               graft;  Surgeon: Elisabeth Craig RAMAN, MD;  Location: [facility];  Service: Plastics;  Laterality:               Bilateral;  2  hours     Reproductive/Obstetrics negative OB ROS                              Anesthesia Physical Anesthesia Plan  ASA: 2  Anesthesia Plan: General   Post-op Pain Management: Minimal or no pain anticipated   Induction: Intravenous  PONV Risk Score and Plan: Propofol  infusion and TIVA  Airway Management Planned: Natural Airway  Additional Equipment:   Intra-op Plan:   Post-operative Plan:   Informed Consent: I have reviewed the patients History and Physical, chart, labs and discussed the procedure including the risks, benefits and alternatives for the proposed anesthesia with the patient or authorized representative who has indicated his/her understanding and acceptance.     Dental Advisory Given  Plan Discussed with: CRNA and Surgeon  Anesthesia Plan Comments:          Anesthesia Quick Evaluation

## 2024-01-17 NOTE — Op Note (Signed)
 [facility]astroenter[facility] Patient Name: Nicole Lambert Procedure Date: 01/17/2024 11:27 AM MRN: [id number] Account #: [id number] Date of Birth: 08/30/1975 Admit Type: Outpatient Age: 48 Room: [facility] ENDO ROOM 4 Gender: Female Note Status: Finalized Instrument Name: Colon Scope [id number] Procedure:             Colonoscopy Indications:           Screening for colorectal malignant neoplasm, This is                         the patient's first colonoscopy Providers:             Corinn Jess Brooklyn MD, MD Referring MD:          Tamra Leventhal, MD (Referring MD) Medicines:             General Anesthesia Complications:         No immediate complications. Estimated blood loss: None. Procedure:             Pre-Anesthesia Assessment:                        - Prior to the procedure, a History and Physical was                         performed, and patient medications and allergies were                         reviewed. The patient is competent. The risks and                         benefits of the procedure and the sedation options and                         risks were discussed with the patient. All questions                         were answered and informed consent was obtained.                         Patient identification and proposed procedure were                         verified by the physician, the nurse, the                         anesthesiologist, the anesthetist and the technician                         in the pre-procedure area in the procedure room in the                         endoscopy suite. Mental Status Examination: alert and                         oriented. Airway Examination: normal oropharyngeal                         airway and neck mobility. Respiratory Examination:  clear to auscultation. CV Examination: normal.                         Prophylactic Antibiotics: The patient does not require                          prophylactic antibiotics. Prior Anticoagulants: The                         patient has taken no anticoagulant or antiplatelet                         agents. ASA Grade Assessment: II - A patient with mild                         systemic disease. After reviewing the risks and                         benefits, the patient was deemed in satisfactory                         condition to undergo the procedure. The anesthesia                         plan was to use general anesthesia. Immediately prior                         to administration of medications, the patient was                         re-assessed for adequacy to receive sedatives. The                         heart rate, respiratory rate, oxygen saturations,                         blood pressure, adequacy of pulmonary ventilation, and                         response to care were monitored throughout the                         procedure. The physical status of the patient was                         re-assessed after the procedure.                        After obtaining informed consent, the colonoscope was                         passed under direct vision. Throughout the procedure,                         the patient's blood pressure, pulse, and oxygen                         saturations were monitored continuously. The  Colonoscope was introduced through the anus and                         advanced to the the cecum, identified by appendiceal                         orifice and ileocecal valve. The colonoscopy was                         performed with moderate difficulty due to significant                         looping and the patient's body habitus. Successful                         completion of the procedure was aided by applying                         abdominal pressure. The patient tolerated the                         procedure well. The quality of the bowel preparation                          was evaluated using the BBPS [facility] Bowel Preparation                         Scale) with scores of: Right Colon = 3, Transverse                         Colon = 3 and Left Colon = 3 (entire mucosa seen well                         with no residual staining, small fragments of stool or                         opaque liquid). The total BBPS score equals 9. The                         ileocecal valve, appendiceal orifice, and rectum were                         photographed. Findings:      The perianal and digital rectal examinations were normal. Pertinent       negatives include normal sphincter tone and no palpable rectal lesions.      The entire examined colon appeared normal.      The retroflexed view of the distal rectum and anal verge was normal and       showed no anal or rectal abnormalities. Impression:            - The entire examined colon is normal.                        - The distal rectum and anal verge are normal on                         retroflexion view.                        -  No specimens collected. Recommendation:        - Discharge patient to home (with escort).                        - Resume previous diet today.                        - Continue present medications.                        - Repeat colonoscopy in 10 years for screening                         purposes. Procedure Code(s):     --- Professional ---                        H9878, Colorectal cancer screening; colonoscopy on                         individual not meeting criteria for high risk Diagnosis Code(s):     --- Professional ---                        Z12.11, Encounter for screening for malignant neoplasm                         of colon CPT copyright 2022 American Medical Association. All rights reserved. The codes documented in this report are preliminary and upon coder review may  be revised to meet current compliance requirements. Dr. Corinn Brooklyn Corinn Jess Brooklyn MD, MD 01/17/2024  12:03:10 PM This report has been signed electronically. Number of Addenda: 0 Note Initiated On: 01/17/2024 11:27 AM Scope Withdrawal Time: 0 hours 7 minutes 31 seconds  Total Procedure Duration: 0 hours 17 minutes 12 seconds  Estimated Blood Loss:  Estimated blood loss: none.      [facility]

## 2024-01-18 ENCOUNTER — Encounter: Payer: Self-pay | Admitting: Gastroenterology

## 2024-03-18 ENCOUNTER — Emergency Department

## 2024-03-18 ENCOUNTER — Other Ambulatory Visit: Payer: Self-pay

## 2024-03-18 ENCOUNTER — Observation Stay: Admission: EM | Admit: 2024-03-18 | Discharge: 2024-03-19 | Disposition: A

## 2024-03-18 DIAGNOSIS — I10 Essential (primary) hypertension: Secondary | ICD-10-CM | POA: Insufficient documentation

## 2024-03-18 DIAGNOSIS — Z7982 Long term (current) use of aspirin: Secondary | ICD-10-CM | POA: Insufficient documentation

## 2024-03-18 DIAGNOSIS — R109 Unspecified abdominal pain: Principal | ICD-10-CM

## 2024-03-18 DIAGNOSIS — K810 Acute cholecystitis: Principal | ICD-10-CM | POA: Diagnosis present

## 2024-03-18 DIAGNOSIS — Z79899 Other long term (current) drug therapy: Secondary | ICD-10-CM | POA: Insufficient documentation

## 2024-03-18 LAB — COMPREHENSIVE METABOLIC PANEL WITH GFR
ALT: 43 U/L (ref 0–44)
AST: 75 U/L — ABNORMAL HIGH (ref 15–41)
Albumin: 4.1 g/dL (ref 3.5–5.0)
Alkaline Phosphatase: 81 U/L (ref 38–126)
Anion gap: 13 (ref 5–15)
BUN: 11 mg/dL (ref 6–20)
CO2: 21 mmol/L — ABNORMAL LOW (ref 22–32)
Calcium: 9.4 mg/dL (ref 8.9–10.3)
Chloride: 109 mmol/L (ref 98–111)
Creatinine, Ser: 0.61 mg/dL (ref 0.44–1.00)
GFR, Estimated: >60 mL/min
Glucose, Bld: 125 mg/dL — ABNORMAL HIGH (ref 70–99)
Potassium: 3.7 mmol/L (ref 3.5–5.1)
Sodium: 142 mmol/L (ref 135–145)
Total Bilirubin: 0.5 mg/dL (ref 0.0–1.2)
Total Protein: 7.3 g/dL (ref 6.5–8.1)

## 2024-03-18 LAB — CBC
HCT: 39.2 % (ref 36.0–46.0)
Hemoglobin: 13.1 g/dL (ref 12.0–15.0)
MCH: 28.2 pg (ref 26.0–34.0)
MCHC: 33.4 g/dL (ref 30.0–36.0)
MCV: 84.3 fL (ref 80.0–100.0)
Platelets: 265 10*3/uL (ref 150–400)
RBC: 4.65 MIL/uL (ref 3.87–5.11)
RDW: 12.8 % (ref 11.5–15.5)
WBC: 4.8 10*3/uL (ref 4.0–10.5)
nRBC: 0 % (ref 0.0–0.2)

## 2024-03-18 LAB — LIPASE, BLOOD: Lipase: 33 U/L (ref 11–51)

## 2024-03-18 LAB — TROPONIN T, HIGH SENSITIVITY: Troponin T High Sensitivity: <6 ng/L (ref 0–19)

## 2024-03-18 MED ORDER — LACTATED RINGERS IV SOLN: INTRAVENOUS | Status: DC

## 2024-03-18 MED ORDER — ONDANSETRON 4 MG PO TBDP: 4.0000 mg | ORAL_TABLET | Freq: Four times a day (QID) | ORAL | Status: DC | PRN

## 2024-03-18 MED ORDER — OXYCODONE HCL 5 MG PO TABS: 5.0000 mg | ORAL_TABLET | ORAL | Status: DC | PRN

## 2024-03-18 MED ORDER — ONDANSETRON HCL 4 MG/2ML IJ SOLN: 4.0000 mg | Freq: Four times a day (QID) | INTRAMUSCULAR | Status: DC | PRN

## 2024-03-18 MED ORDER — PIPERACILLIN-TAZOBACTAM 3.375 G IVPB
3.3750 g | Freq: Three times a day (TID) | INTRAVENOUS | Status: DC
  Administered 2024-03-19 (×2): 3.375 g via INTRAVENOUS
  Filled 2024-03-18 (×2): qty 50

## 2024-03-18 MED ORDER — FENTANYL CITRATE (PF) 50 MCG/ML IJ SOSY
50.0000 ug | PREFILLED_SYRINGE | Freq: Once | INTRAMUSCULAR | Status: AC
  Administered 2024-03-18: 50 ug via INTRAVENOUS
  Filled 2024-03-18: qty 1

## 2024-03-18 MED ORDER — ONDANSETRON HCL 4 MG/2ML IJ SOLN
4.0000 mg | Freq: Once | INTRAMUSCULAR | Status: AC
  Administered 2024-03-18: 4 mg via INTRAVENOUS
  Filled 2024-03-18: qty 2

## 2024-03-18 MED ORDER — MORPHINE SULFATE (PF) 2 MG/ML IV SOLN: 2.0000 mg | INTRAVENOUS | Status: DC | PRN

## 2024-03-18 NOTE — ED Triage Notes (Signed)
 Pt comes in via pov with complaints of epigastric abdominal pain that radiates to her back. Pt reports eating eggs and tomatoes this morning, falling asleep and waking up with sever abdominal pain. Pt denies vomiting, but does report diarrhea.  Pt reports 3 episodes of diarrhea.

## 2024-03-19 ENCOUNTER — Other Ambulatory Visit: Payer: Self-pay

## 2024-03-19 DIAGNOSIS — K810 Acute cholecystitis: Principal | ICD-10-CM

## 2024-03-19 LAB — URINALYSIS, ROUTINE W REFLEX MICROSCOPIC
Bilirubin Urine: NEGATIVE
Glucose, UA: NEGATIVE mg/dL
Ketones, ur: NEGATIVE mg/dL
Leukocytes,Ua: NEGATIVE
Nitrite: NEGATIVE
Protein, ur: NEGATIVE mg/dL
Specific Gravity, Urine: 1.031 — ABNORMAL HIGH (ref 1.005–1.030)
pH: 5 (ref 5.0–8.0)

## 2024-03-19 LAB — TROPONIN T, HIGH SENSITIVITY: Troponin T High Sensitivity: 8 ng/L (ref 0–19)

## 2024-03-19 MED ORDER — AMOXICILLIN-POT CLAVULANATE 875-125 MG PO TABS
1.0000 | ORAL_TABLET | Freq: Two times a day (BID) | ORAL | 0 refills | Status: AC
  Filled 2024-03-19: qty 14, 7d supply, fill #0

## 2024-03-19 NOTE — ED Notes (Signed)
 Pt given DC instructions. Pt verbalized understanding of medications and follow up care. Pt taken from ED in wheelchair by this RN.

## 2024-03-19 NOTE — Discharge Summary (Signed)
 Patient ID: Nicole Lambert MRN: [id number] DOB/AGE: [birth year] 49 y.o.  Admit date: 03/18/2024 Discharge date: 03/19/2024   Discharge Diagnoses:  Principal Problem:   Acute cholecystitis   Procedures:none  Hospital Course:   admitted with findings consistent with acute cholecystitis, opted not to undergo operative intervention. Will see in clinic, d/c on abx     Consults: None  Disposition: Discharge disposition: 01-Home or Self Care       Discharge Instructions     Discharge instructions   Complete by: As directed    You may take tylenol  and ibuprofen as needed for pain. You should follow up in my office in one week.   Increase activity slowly   Complete by: As directed       Allergies as of 03/19/2024   No Known Allergies      Medication List     TAKE these medications    amLODipine  5 MG tablet Commonly known as: NORVASC  Take 5 mg by mouth daily.   amoxicillin -clavulanate 875-125 MG tablet Commonly known as: AUGMENTIN  Take 1 tablet by mouth 2 (two) times daily for 7 days.   aspirin EC 81 MG tablet Take 81 mg by mouth daily. Swallow whole.   atorvastatin 10 MG tablet Commonly known as: LIPITOR Take 10 mg by mouth daily.   fenofibrate 145 MG tablet Commonly known as: TRICOR Take 145 mg by mouth daily.   Vitamin D (Ergocalciferol) 1.25 MG (50000 UNIT) Caps capsule Commonly known as: DRISDOL Take 50,000 Units by mouth once a week.          Jayson Endow, M.D.

## 2024-03-20 LAB — HIV ANTIBODY (ROUTINE TESTING W REFLEX): HIV Screen 4th Generation wRfx: NONREACTIVE

## 2024-03-26 ENCOUNTER — Ambulatory Visit: Payer: Self-pay | Admitting: General Surgery
